# Patient Record
Sex: Male | Born: 1965
Health system: Southern US, Community
[De-identification: ages and names within clinical notes are randomized; demographics above are authoritative.]

## PROBLEM LIST (undated history)

## (undated) DIAGNOSIS — F419 Anxiety disorder, unspecified: Secondary | ICD-10-CM

## (undated) DIAGNOSIS — E785 Hyperlipidemia, unspecified: Secondary | ICD-10-CM

## (undated) DIAGNOSIS — T7840XA Allergy, unspecified, initial encounter: Secondary | ICD-10-CM

## (undated) DIAGNOSIS — F329 Major depressive disorder, single episode, unspecified: Secondary | ICD-10-CM

## (undated) DIAGNOSIS — F32A Depression, unspecified: Secondary | ICD-10-CM

## (undated) HISTORY — DX: Allergy, unspecified, initial encounter: T78.40XA

## (undated) HISTORY — DX: Anxiety disorder, unspecified: F41.9

## (undated) HISTORY — DX: Hyperlipidemia, unspecified: E78.5

## (undated) HISTORY — DX: Depression, unspecified: F32.A

---
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
## 2008-12-17 LAB — HM COLONOSCOPY

## 2009-08-24 ENCOUNTER — Ambulatory Visit: Payer: Self-pay | Admitting: Family Medicine

## 2009-10-25 ENCOUNTER — Ambulatory Visit: Payer: Self-pay | Admitting: Family Medicine

## 2009-11-17 LAB — HM COLONOSCOPY: HM Colonoscopy: NEGATIVE

## 2010-01-05 ENCOUNTER — Ambulatory Visit: Payer: Self-pay | Admitting: Family Medicine

## 2010-01-05 ENCOUNTER — Encounter: Admission: RE | Admit: 2010-01-05 | Discharge: 2010-01-05 | Payer: Self-pay | Admitting: Family Medicine

## 2010-01-06 ENCOUNTER — Ambulatory Visit: Payer: Self-pay | Admitting: Family Medicine

## 2010-04-05 ENCOUNTER — Ambulatory Visit: Payer: Self-pay | Admitting: Family Medicine

## 2010-12-17 HISTORY — PX: COLONOSCOPY: SHX174

## 2011-03-20 ENCOUNTER — Ambulatory Visit (INDEPENDENT_AMBULATORY_CARE_PROVIDER_SITE_OTHER): Payer: PRIVATE HEALTH INSURANCE | Admitting: Family Medicine

## 2011-03-20 DIAGNOSIS — E785 Hyperlipidemia, unspecified: Secondary | ICD-10-CM

## 2011-03-20 DIAGNOSIS — Z7189 Other specified counseling: Secondary | ICD-10-CM

## 2011-05-19 ENCOUNTER — Encounter: Payer: Self-pay | Admitting: Family Medicine

## 2011-05-22 ENCOUNTER — Ambulatory Visit (INDEPENDENT_AMBULATORY_CARE_PROVIDER_SITE_OTHER): Payer: PRIVATE HEALTH INSURANCE | Admitting: Family Medicine

## 2011-05-22 ENCOUNTER — Encounter: Payer: Self-pay | Admitting: Family Medicine

## 2011-05-22 VITALS — BP 130/84 | HR 72 | Wt 234.0 lb

## 2011-05-22 DIAGNOSIS — M722 Plantar fascial fibromatosis: Secondary | ICD-10-CM

## 2011-05-22 DIAGNOSIS — E669 Obesity, unspecified: Secondary | ICD-10-CM | POA: Insufficient documentation

## 2011-05-22 DIAGNOSIS — Z79899 Other long term (current) drug therapy: Secondary | ICD-10-CM

## 2011-05-22 DIAGNOSIS — E785 Hyperlipidemia, unspecified: Secondary | ICD-10-CM

## 2011-05-22 LAB — LIPID PANEL
Total CHOL/HDL Ratio: 4.7 Ratio
Triglycerides: 359 mg/dL — ABNORMAL HIGH (ref <150)
VLDL: 72 mg/dL — ABNORMAL HIGH (ref 0–40)

## 2011-05-22 LAB — HEPATIC FUNCTION PANEL
AST: 25 U/L (ref 0–37)
Alkaline Phosphatase: 70 U/L (ref 39–117)
Total Bilirubin: 0.5 mg/dL (ref 0.3–1.2)

## 2011-05-22 NOTE — Progress Notes (Signed)
  Subjective:    Patient ID: Garrett Holmes, male    DOB: Aug 01, 1966, 45 y.o.   MRN: [id number]  HPI He is here for a recheck. Psychologically he is doing better. Probably some of the things are causing trouble have been resolved. He says that he felt better after our last encounter just getting some levels negative thoughts out of his system. He also is on Lipitor and is having no difficulty with this. He quit smoking in January. He does continue to have foot pain. He has had surgery on his left foot which is not causing much trouble. The right foot is causing trouble.   Review of Systems     Objective:   Physical Exam alert and in no distress otherwise not examined        Assessment & Plan:  Situational stress. Dyslipidemia. Plantar fascitis I will check liver and lipid panel on him today I also discussed diet and exercise with him. Shunt encouraged him to make changes especially regard to carbohydrates. Also recommend using arch supports as much is possible to help the plantar fasciitis

## 2011-05-22 NOTE — Patient Instructions (Signed)
Continue on your Lipitor. Make sure you wear arch supports to help with your feet. The chart carbohydrate intake especially in regard to weight foods.

## 2011-05-23 ENCOUNTER — Telehealth: Payer: Self-pay

## 2011-05-23 NOTE — Telephone Encounter (Signed)
Pt informed labs look good

## 2011-11-05 ENCOUNTER — Encounter: Payer: Self-pay | Admitting: Family Medicine

## 2011-11-05 ENCOUNTER — Ambulatory Visit (INDEPENDENT_AMBULATORY_CARE_PROVIDER_SITE_OTHER): Payer: PRIVATE HEALTH INSURANCE | Admitting: Family Medicine

## 2011-11-05 VITALS — BP 122/80 | HR 62 | Wt 234.0 lb

## 2011-11-05 DIAGNOSIS — H9209 Otalgia, unspecified ear: Secondary | ICD-10-CM

## 2011-11-05 DIAGNOSIS — H9201 Otalgia, right ear: Secondary | ICD-10-CM

## 2011-11-05 NOTE — Patient Instructions (Signed)
Use a decongestant to help relieve your her pressure symptoms

## 2011-11-05 NOTE — Progress Notes (Signed)
  Subjective:    Patient ID: Garrett Holmes, male    DOB: 1966/04/19, 45 y.o.   MRN: [id number]  HPI He complains of a one-week history of right ear pressure as well as sinus pressure but no fever chills sore throat or earache. He does not smoke   Review of Systems     Objective:   Physical Exam alert and in no distress. Tympanic membranes and canals are normal. Throat is clear. Tonsils are normal. Neck is supple without adenopathy or thyromegaly. Cardiac exam shows a regular sinus rhythm without murmurs or gallops. Lungs are clear to auscultation.        Assessment & Plan:  Otalgia Patient to use a decongestant to help with symptoms.

## 2012-01-31 ENCOUNTER — Encounter: Payer: Self-pay | Admitting: Family Medicine

## 2012-01-31 ENCOUNTER — Ambulatory Visit (INDEPENDENT_AMBULATORY_CARE_PROVIDER_SITE_OTHER): Payer: PRIVATE HEALTH INSURANCE | Admitting: Family Medicine

## 2012-01-31 VITALS — BP 120/78 | HR 63 | Ht 68.0 in | Wt 219.0 lb

## 2012-01-31 DIAGNOSIS — E669 Obesity, unspecified: Secondary | ICD-10-CM

## 2012-01-31 DIAGNOSIS — J3489 Other specified disorders of nose and nasal sinuses: Secondary | ICD-10-CM

## 2012-01-31 DIAGNOSIS — G2571 Drug induced akathisia: Secondary | ICD-10-CM

## 2012-01-31 DIAGNOSIS — B356 Tinea cruris: Secondary | ICD-10-CM

## 2012-01-31 DIAGNOSIS — R259 Unspecified abnormal involuntary movements: Secondary | ICD-10-CM

## 2012-01-31 DIAGNOSIS — Z0000 Encounter for general adult medical examination without abnormal findings: Secondary | ICD-10-CM

## 2012-01-31 DIAGNOSIS — E785 Hyperlipidemia, unspecified: Secondary | ICD-10-CM

## 2012-01-31 MED ORDER — METOPROLOL SUCCINATE ER 25 MG PO TB24: 25.0000 mg | ORAL_TABLET | Freq: Every day | ORAL | Status: DC

## 2012-01-31 MED ORDER — FLUCONAZOLE 100 MG PO TABS: 100.0000 mg | ORAL_TABLET | Freq: Every day | ORAL | Status: AC

## 2012-01-31 NOTE — Progress Notes (Signed)
  Subjective:    Patient ID: Garrett Holmes, male    DOB: 1966-09-27, 46 y.o.   MRN: [id number]  HPI    Review of Systems     Objective:   Physical Exam BP 120/78  Pulse 63  Ht 5\' 8"  (1.727 m)  Wt 219 lb (99.338 kg)  BMI 33.30 kg/m2  General Appearance:    Alert, cooperative, no distress, appears stated age  Head:    Normocephalic, without obvious abnormality, atraumatic  Eyes:    PERRL, conjunctiva/corneas clear, EOM's intact, fundi    benign  Ears:    Normal TM's and external ear canals  Nose:   Nares normal, mucosa normal, no drainage or sinus   tenderness  Throat:   Lips, mucosa, and tongue normal; teeth and gums normal  Neck:   Supple, no lymphadenopathy;  thyroid:  no   enlargement/tenderness/nodules; no carotid   bruit or JVD  Back:    Spine nontender, no curvature, ROM normal, no CVA     tenderness  Lungs:     Clear to auscultation bilaterally without wheezes, rales or     ronchi; respirations unlabored  Chest Wall:    No tenderness or deformity   Heart:    Regular rate and rhythm, S1 and S2 normal, no murmur, rub   or gallop  Breast Exam:    No chest wall tenderness, masses or gynecomastia  Abdomen:     Soft, non-tender, nondistended, normoactive bowel sounds,    no masses, no hepatosplenomegaly  Genitalia:    Normal male external genitalia without lesions.  Testicles without masses.  No inguinal hernias.  Rectal:    Normal sphincter tone, no masses or tenderness; guaiac negative stool.  Prostate smooth, no nodules, not enlarged.  Extremities:   No clubbing, cyanosis or edema  Pulses:   2+ and symmetric all extremities  Skin:   Skin color, texture, turgor normal, erythematous and slightly raised and scaly lesions noted on the inner aspect of both thighs. Scrotum is clear  Exam of his nose does show a 0.5 cm pigmented lesion present regimen nose on the left   Lymph nodes:   Cervical, supraclavicular, and axillary nodes normal  Neurologic:   CNII-XII intact, normal  strength, sensation and gait; reflexes 2+ and symmetric throughout          Psych:   Normal mood, affect, hygiene and grooming.           Assessment & Plan:  BP 120/78  Pulse 63  Ht 5\' 8"  (1.727 m)  Wt 219 lb (99.338 kg)  BMI 33.30 kg/m2 1. Restless movement disorder  metoprolol succinate (TOPROL-XL) 25 MG 24 hr tablet  2. Nasal lesion  Ambulatory referral to Dermatology  3. Tinea cruris    4. Routine general medical examination at a health care facility    5. Hyperlipidemia  Lipid panel  6. Dyslipidemia    7. Obesity (BMI 30-39.9)     recommended for the tinea cruris Diflucan and also to use Lamisil. He will use the Lamisil regularly and spread it as much as he can to keep this under control. Also discussed dietary modification with him. He is to let me know how the Toprol is working.

## 2012-01-31 NOTE — Patient Instructions (Addendum)
Use Lamisil topical. Spread it as much as you can to keep it under control. I will give you Toprol and let renal pelvis works for your restlessness. It should not take long for this to work so call next week

## 2012-02-01 ENCOUNTER — Encounter: Payer: Self-pay | Admitting: Internal Medicine

## 2012-02-01 LAB — LIPID PANEL
Cholesterol: 156 mg/dL (ref 0–200)
HDL: 37 mg/dL — ABNORMAL LOW (ref >39)
LDL Cholesterol: 99 mg/dL (ref 0–99)
Triglycerides: 99 mg/dL (ref <150)
VLDL: 20 mg/dL (ref 0–40)

## 2012-02-01 MED ORDER — ATORVASTATIN CALCIUM 40 MG PO TABS: 40.0000 mg | ORAL_TABLET | Freq: Every day | ORAL | Status: DC

## 2012-02-01 NOTE — Progress Notes (Signed)
Addended by: Ronnald Nian on: 02/01/2012 08:41 AM   Modules accepted: Orders

## 2012-02-19 ENCOUNTER — Telehealth: Payer: Self-pay | Admitting: Family Medicine

## 2012-02-19 NOTE — Telephone Encounter (Signed)
Pt called and stated that Toprol is not working and he never got a call about referral to dermatology. Pt states he will call dermatologist himself and set up appointment but wanted you to know med is not working. Pt uses [facility] [address]

## 2012-02-19 NOTE — Telephone Encounter (Signed)
Apparently the history part of his exam is missing for unknown reasons. Make sure he gets the dermatology appointment set up. Have him followup with me concerning the Toprol.

## 2012-02-20 NOTE — Telephone Encounter (Signed)
Pt is scheduled to follow-up with you tomorrow about med not working and he has already scheduled himself to dermatology

## 2012-02-21 ENCOUNTER — Encounter: Payer: Self-pay | Admitting: Family Medicine

## 2012-02-21 ENCOUNTER — Ambulatory Visit (INDEPENDENT_AMBULATORY_CARE_PROVIDER_SITE_OTHER): Payer: PRIVATE HEALTH INSURANCE | Admitting: Family Medicine

## 2012-02-21 VITALS — BP 114/70 | HR 55 | Wt 227.0 lb

## 2012-02-21 DIAGNOSIS — G2581 Restless legs syndrome: Secondary | ICD-10-CM

## 2012-02-21 MED ORDER — PRAMIPEXOLE DIHYDROCHLORIDE 0.125 MG PO TABS: 0.1250 mg | ORAL_TABLET | Freq: Three times a day (TID) | ORAL | Status: DC

## 2012-02-21 NOTE — Patient Instructions (Addendum)
Take one pill near bedtime for several days and if no improvement then increase that to 2 pills and then to 3 and then even to 4 if we need to. Let me know what strength works or if you have side effects

## 2012-02-21 NOTE — Progress Notes (Signed)
  Subjective:    Patient ID: Garrett Holmes, male    DOB: 11/19/1966, 46 y.o.   MRN: [id number]  HPI He is here for a recheck. His dictation on the last visit is incomplete. He has a long history of difficulty with restless legs and in the past had tried both Requip and clonazepam with inadequate results. The restless legs bothers him mainly at night but does start prior to him going to bed.  Review of Systems     Objective:   Physical Exam Alert and in no distress otherwise not examined       Assessment & Plan:   1. RLS (restless legs syndrome)    I will switch him to Mirapex. Discussed slowly increasing this based on its response. If he does not respond to this, referral to neurology will probably be made.

## 2012-08-20 ENCOUNTER — Other Ambulatory Visit: Payer: Self-pay | Admitting: Family Medicine

## 2012-08-26 ENCOUNTER — Telehealth: Payer: Self-pay | Admitting: Family Medicine

## 2012-08-26 NOTE — Telephone Encounter (Signed)
LM

## 2013-02-02 ENCOUNTER — Ambulatory Visit (INDEPENDENT_AMBULATORY_CARE_PROVIDER_SITE_OTHER): Payer: BC Managed Care – PPO | Admitting: Family Medicine

## 2013-02-02 ENCOUNTER — Encounter: Payer: Self-pay | Admitting: Family Medicine

## 2013-02-02 VITALS — BP 130/88 | HR 68 | Ht 68.5 in | Wt 233.0 lb

## 2013-02-02 DIAGNOSIS — F40240 Claustrophobia: Secondary | ICD-10-CM

## 2013-02-02 DIAGNOSIS — M7710 Lateral epicondylitis, unspecified elbow: Secondary | ICD-10-CM

## 2013-02-02 DIAGNOSIS — Z0000 Encounter for general adult medical examination without abnormal findings: Secondary | ICD-10-CM

## 2013-02-02 DIAGNOSIS — M7711 Lateral epicondylitis, right elbow: Secondary | ICD-10-CM

## 2013-02-02 DIAGNOSIS — B356 Tinea cruris: Secondary | ICD-10-CM

## 2013-02-02 DIAGNOSIS — E669 Obesity, unspecified: Secondary | ICD-10-CM

## 2013-02-02 DIAGNOSIS — F40298 Other specified phobia: Secondary | ICD-10-CM

## 2013-02-02 DIAGNOSIS — M722 Plantar fascial fibromatosis: Secondary | ICD-10-CM

## 2013-02-02 DIAGNOSIS — E785 Hyperlipidemia, unspecified: Secondary | ICD-10-CM

## 2013-02-02 LAB — HEMOCCULT GUIAC POC 1CARD (OFFICE)

## 2013-02-02 LAB — CBC WITH DIFFERENTIAL/PLATELET
Basophils Absolute: 0.1 10*3/uL (ref 0.0–0.1)
Basophils Relative: 0 % (ref 0–1)
HCT: 43.4 % (ref 39.0–52.0)
Lymphocytes Relative: 23 % (ref 12–46)
MCH: 30 pg (ref 26.0–34.0)
Neutrophils Relative %: 66 % (ref 43–77)
Platelets: 255 10*3/uL (ref 150–400)
RDW: 13.9 % (ref 11.5–15.5)
WBC: 11.5 10*3/uL — ABNORMAL HIGH (ref 4.0–10.5)

## 2013-02-02 LAB — POCT URINALYSIS DIPSTICK
Nitrite, UA: NEGATIVE
Protein, UA: NEGATIVE

## 2013-02-02 MED ORDER — ATORVASTATIN CALCIUM 40 MG PO TABS: ORAL_TABLET | ORAL | Status: DC

## 2013-02-02 MED ORDER — FLUCONAZOLE 100 MG PO TABS: 100.0000 mg | ORAL_TABLET | Freq: Every day | ORAL | Status: DC

## 2013-02-02 NOTE — Patient Instructions (Addendum)
If you note aches and pains in her muscles while on the Diflucan let me know Do as many things as you can palms up and open Do the stretching which her feet use the rolling pin; use arch supports versus heel cups

## 2013-02-02 NOTE — Progress Notes (Signed)
Subjective:    Patient ID: Garrett Holmes, male    DOB: Jun 06, 1966, 47 y.o.   MRN: [id number]  HPI He is here for complete examination. He has had difficulty with tinea cruris and in the past had used Diflucan 100 mg for 10 days followed by Lamisil topical but unfortunately he would get a recurrence of the condition.he also has a very long history of difficulty with restless legs and in the past had tried Mirapex, Requip and Klonopin all of which have not worked well. He also complains of symptoms consistent with claustrophobia. He cannot write a car for long distance before he gets quite antsy. He cannot going to very close spaces again due to becoming quite anxious. He also complains of bilateral plantar fasciitis that he has had treated in the past. He is starting to treat him again with conservative measures. He apparently did have ultrasound treatment of this. He also has bilateral lateral elbow pain especially with work related activities. He continues on Lipitor.he is up-to-date on his immunizations, has had a colonoscopy.his work and home life are going well.   Review of Systems  Constitutional: Negative.   HENT: Negative.   Eyes: Negative.   Cardiovascular: Negative.   Gastrointestinal: Negative.   Endocrine: Negative.   Genitourinary: Negative.   Musculoskeletal: Negative.   Allergic/Immunologic: Negative.   Neurological: Negative.   Hematological: Negative.        Objective:   Physical Exam BP 130/88  Pulse 68  Ht 5' 8.5" (1.74 m)  Wt 233 lb (105.688 kg)  BMI 34.91 kg/m2  SpO2 98%  General Appearance:    Alert, cooperative, no distress, appears stated age  Head:    Normocephalic, without obvious abnormality, atraumatic  Eyes:    PERRL, conjunctiva/corneas clear, EOM's intact, fundi    benign  Ears:    Normal TM's and external ear canals  Nose:   Nares normal, mucosa normal, no drainage or sinus   tenderness  Throat:   Lips, mucosa, and tongue normal; teeth and gums  normal  Neck:   Supple, no lymphadenopathy;  thyroid:  no   enlargement/tenderness/nodules; no carotid   bruit or JVD  Back:    Spine nontender, no curvature, ROM normal, no CVA     tenderness  Lungs:     Clear to auscultation bilaterally without wheezes, rales or     ronchi; respirations unlabored  Chest Wall:    No tenderness or deformity   Heart:    Regular rate and rhythm, S1 and S2 normal, no murmur, rub   or gallop  Breast Exam:    No chest wall tenderness, masses or gynecomastia  Abdomen:     Soft, non-tender, nondistended, normoactive bowel sounds,    no masses, no hepatosplenomegaly  Genitalia:    Normal male external genitalia without lesions.  Testicles without masses.  No inguinal hernias.  Rectal:    Normal sphincter tone, no masses or tenderness; guaiac negative stool.  Prostate smooth, no nodules, not enlarged.  Extremities:   No clubbing, cyanosis or edema.tender to palpation over both lateral epicondyles with good motion of the elbows. He is also tender to palpation over the bilateral calcaneal spurs with good motion of the ankles.  Pulses:   2+ and symmetric all extremities  Skin:   Skin color, texture, turgor normal, no rashes or lesions  Lymph nodes:   Cervical, supraclavicular, and axillary nodes normal  Neurologic:   CNII-XII intact, normal strength, sensation and gait; reflexes 2+ and  symmetric throughout          Psych:   Normal mood, affect, hygiene and grooming.           Assessment & Plan:  Routine general medical examination at a health care facility - Plan: POCT Urinalysis Dipstick, Lipid panel, CBC with Differential, Comprehensive metabolic panel, Hemoccult - 1 Card (office)  Tinea cruris - Plan: fluconazole (DIFLUCAN) 100 MG tablet  Claustrophobia  Obesity (BMI 30-39.9)  Plantar fasciitis, bilateral  Hyperlipidemia - Plan: Lipid panel, atorvastatin (LIPITOR) 40 MG tablet  Lateral epicondylitis of both elbows he is to use the Diflucan for one month  and also use a topical. We will try to get to the point where he can use topical to keep the tinea under control. Discussed to claustrophobia with him. At this point no therapy is necessary. For the plantar fasciitis he is to continue with conservative care. Also discussed using nighttime splints. For the lateral epicondylitis, recommend doing as many things as he can palms up and open. We discussed weight loss with him especially in regard to cutting back on carbohydrates.

## 2013-02-03 ENCOUNTER — Other Ambulatory Visit: Payer: Self-pay

## 2013-02-03 DIAGNOSIS — E782 Mixed hyperlipidemia: Secondary | ICD-10-CM

## 2013-02-03 LAB — COMPREHENSIVE METABOLIC PANEL
Albumin: 4.7 g/dL (ref 3.5–5.2)
Alkaline Phosphatase: 73 U/L (ref 39–117)
CO2: 27 mEq/L (ref 19–32)
Calcium: 9.9 mg/dL (ref 8.4–10.5)
Chloride: 103 mEq/L (ref 96–112)
Potassium: 4.9 mEq/L (ref 3.5–5.3)
Total Bilirubin: 0.6 mg/dL (ref 0.3–1.2)
Total Protein: 7.1 g/dL (ref 6.0–8.3)

## 2013-02-03 LAB — LIPID PANEL
Cholesterol: 206 mg/dL — ABNORMAL HIGH (ref 0–200)
HDL: 47 mg/dL (ref >39)
Triglycerides: 422 mg/dL — ABNORMAL HIGH (ref <150)

## 2013-02-03 NOTE — Progress Notes (Signed)
Quick Note:  Let him know that his triglycerides are quite elevated. Send her dietary information and have him recheck that in 2 months. ______

## 2013-02-03 NOTE — Progress Notes (Signed)
Quick Note:  Mailed pt letter of results and diet along with apt. To come in for lab ______

## 2013-04-01 ENCOUNTER — Telehealth: Payer: Self-pay | Admitting: Family Medicine

## 2013-04-01 NOTE — Telephone Encounter (Signed)
I have form and have faxed to [facility] [phone]

## 2013-04-01 NOTE — Telephone Encounter (Signed)
Pt called and wanted status on the [employer] form that he sent to you, some type of waiver.  Please advise

## 2013-04-02 ENCOUNTER — Other Ambulatory Visit: Payer: Self-pay

## 2013-04-03 ENCOUNTER — Other Ambulatory Visit: Payer: BC Managed Care – PPO

## 2013-04-06 ENCOUNTER — Encounter: Payer: Self-pay | Admitting: Family Medicine

## 2013-04-27 ENCOUNTER — Other Ambulatory Visit: Payer: Self-pay | Admitting: Family Medicine

## 2013-05-12 ENCOUNTER — Telehealth: Payer: Self-pay | Admitting: Internal Medicine

## 2013-05-12 NOTE — Telephone Encounter (Signed)
Pt needs a refill on diflucan 100mg  to [address]

## 2013-05-12 NOTE — Telephone Encounter (Signed)
Have him set up an appointment for followup.

## 2013-05-12 NOTE — Telephone Encounter (Signed)
Pt needs a refill on diflucan 100mg to [facility] [address] 

## 2013-05-13 ENCOUNTER — Encounter: Payer: Self-pay | Admitting: Family Medicine

## 2013-05-13 ENCOUNTER — Ambulatory Visit (INDEPENDENT_AMBULATORY_CARE_PROVIDER_SITE_OTHER): Payer: BC Managed Care – PPO | Admitting: Family Medicine

## 2013-05-13 VITALS — BP 120/80 | HR 63 | Wt 233.0 lb

## 2013-05-13 DIAGNOSIS — L909 Atrophic disorder of skin, unspecified: Secondary | ICD-10-CM

## 2013-05-13 DIAGNOSIS — B356 Tinea cruris: Secondary | ICD-10-CM

## 2013-05-13 DIAGNOSIS — L918 Other hypertrophic disorders of the skin: Secondary | ICD-10-CM

## 2013-05-13 MED ORDER — TERBINAFINE HCL 250 MG PO TABS: 250.0000 mg | ORAL_TABLET | Freq: Every day | ORAL | Status: DC

## 2013-05-13 NOTE — Progress Notes (Signed)
  Subjective:    Patient ID: Garrett Holmes, male    DOB: [birth year], 47 y.o.   MRN: [id number]  HPI He is here for recheck. He states that the Diflucan did help control his rash but did not make it go way and that started to reappear recently. He also has a skin tag present in the right inner thigh area it is giving him trouble mechanically.   Review of Systems     Objective:   Physical Exam 0.5 cm skin tag noted in the right upper inguinal area. He also has a dry pigmented oval-appearing rash with raised edges. A KOH was positive.       Assessment & Plan:  Skin tag  Tinea cruris - Plan: terbinafine (LAMISIL) 250 MG tablet the skin tag was removed and hyfrecated with silver nitrate without difficulty. I will have him use the Lamisil and topical antifungal regularly for the next month and then have him call me.

## 2013-05-13 NOTE — Patient Instructions (Signed)
Use both topical and oral medication for the next month and then call me to let me know how you're doing. If it's normal then go ahead ands use a topical daily and then every other day and every third day whatever it takes to keep it under control

## 2013-07-28 ENCOUNTER — Ambulatory Visit (INDEPENDENT_AMBULATORY_CARE_PROVIDER_SITE_OTHER): Payer: BC Managed Care – PPO | Admitting: Family Medicine

## 2013-07-28 ENCOUNTER — Encounter: Payer: Self-pay | Admitting: Family Medicine

## 2013-07-28 VITALS — BP 112/76 | HR 60 | Wt 228.0 lb

## 2013-07-28 DIAGNOSIS — B356 Tinea cruris: Secondary | ICD-10-CM

## 2013-07-28 MED ORDER — FLUCONAZOLE 100 MG PO TABS: 100.0000 mg | ORAL_TABLET | Freq: Every day | ORAL | Status: DC

## 2013-07-28 NOTE — Progress Notes (Signed)
  Subjective:    Patient ID: Garrett Holmes, male    DOB: [birth year], 47 y.o.   MRN: [id number]  HPI He is here for recheck. He states that the Lamisil has not had any effect at all. Further discussion with him indicates that Diflucan did indeed get him back to normal skin. Review of record indicates he stated better rather than totally back to normal.   Review of Systems     Objective:   Physical Exam Exam of the inguinal area does show slight dryness, hyperpigmentation especially on the inner thighs with well-demarcated borders.       Assessment & Plan:  Tinea cruris - Plan: fluconazole (DIFLUCAN) 100 MG tablet Take the Diflucan for 2 weeks and see what that'll do to your skin and if you have any aches and pains I need to know. Once the skin is under control back off and use the Diflucan once a week and see if he keeps it under control. If it does then back off to twice per month I explained to him that we had a communication issue of me thinking he was just better but not back to normal. He understands this. We have come up with a game plan to try and keep this under good control. I discussed possible side effects of Diflucan and Lipitor with him.

## 2013-07-28 NOTE — Patient Instructions (Signed)
Take the Diflucan for 2 weeks and see what that'll do to your skin and if you have any aches and pains I need to know. Once the skin is under control back off and use the Diflucan once a week and see if he keeps it under control. If it does then back off to twice per month

## 2013-12-15 ENCOUNTER — Encounter: Payer: Self-pay | Admitting: Family Medicine

## 2013-12-15 ENCOUNTER — Ambulatory Visit (INDEPENDENT_AMBULATORY_CARE_PROVIDER_SITE_OTHER): Payer: BC Managed Care – PPO | Admitting: Family Medicine

## 2013-12-15 VITALS — BP 114/76 | HR 60

## 2013-12-15 DIAGNOSIS — B356 Tinea cruris: Secondary | ICD-10-CM

## 2013-12-15 DIAGNOSIS — F411 Generalized anxiety disorder: Secondary | ICD-10-CM

## 2013-12-15 DIAGNOSIS — F419 Anxiety disorder, unspecified: Secondary | ICD-10-CM

## 2013-12-15 MED ORDER — ALPRAZOLAM 0.25 MG PO TABS: 0.2500 mg | ORAL_TABLET | Freq: Two times a day (BID) | ORAL | Status: DC | PRN

## 2013-12-15 MED ORDER — FLUCONAZOLE 100 MG PO TABS: 100.0000 mg | ORAL_TABLET | Freq: Every day | ORAL | Status: DC

## 2013-12-15 NOTE — Progress Notes (Signed)
   Subjective:    Patient ID: Garrett Holmes, male    DOB: 1966/12/08, 47 y.o.   MRN: [id number]  HPI He is continued had difficulty with tinea cruris. Apparently Diflucan does work and he has been trying to spread out the dosing of this unfortunately the infection has reoccurred. He would like renewal of the medication. At the end of the encounter he then mentioned difficulty dealing with it anxiety, anger, mood swings. He states that anything can get him angry. His wife and children have noted this. During the encounter he did become quite tearful. He relates that in the past he was given Lexapro several years ago but never got involved in counseling. He cannot relate his mood swings and anger to any particular issue.   Review of Systems     Objective:   Physical Exam Alert and in no distress but tearful when discussing his anxiety and anger. Exam of the inguinal area does show slight drying and pigmentation to the inner aspect of both thighs.      Assessment & Plan:  Tinea cruris - Plan: fluconazole (DIFLUCAN) 100 MG tablet  Anxiety - Plan: ALPRAZolam (XANAX) 0.25 MG tablet  discussed the use of Diflucan regularly until he has it under control and then spreading it out as far as he can but not allowing the lesions to recur. Explained that this is a control issue not sure. We then discussed anxiety and anger. I recommended that he set up an appointment with Darryl Hyers. Explained that I will give Xanax but not without counseling. He did seem to understand this and accepted. Followup on this in several weeks.

## 2013-12-15 NOTE — Patient Instructions (Signed)
Garrett Holmes [phone] 

## 2014-03-25 ENCOUNTER — Telehealth: Payer: Self-pay | Admitting: Internal Medicine

## 2014-03-25 NOTE — Telephone Encounter (Signed)
Faxed over medical records to [facility] on march 17th

## 2014-06-30 ENCOUNTER — Other Ambulatory Visit: Payer: Self-pay | Admitting: Family Medicine

## 2014-10-16 ENCOUNTER — Telehealth: Payer: Self-pay | Admitting: Internal Medicine

## 2014-10-16 NOTE — Telephone Encounter (Signed)
Faxed over medical records to [facility] on 10/07/14 @ [phone]

## 2015-02-15 ENCOUNTER — Other Ambulatory Visit: Payer: Self-pay | Admitting: Family Medicine

## 2015-02-15 ENCOUNTER — Ambulatory Visit
Admission: RE | Admit: 2015-02-15 | Discharge: 2015-02-15 | Disposition: A | Discharge status: Home / Self Care | Payer: BLUE CROSS/BLUE SHIELD | Source: Ambulatory Visit | Attending: Family Medicine | Admitting: Family Medicine

## 2015-02-15 DIAGNOSIS — K625 Hemorrhage of anus and rectum: Secondary | ICD-10-CM

## 2015-02-15 DIAGNOSIS — Z789 Other specified health status: Secondary | ICD-10-CM

## 2015-06-02 ENCOUNTER — Ambulatory Visit (INDEPENDENT_AMBULATORY_CARE_PROVIDER_SITE_OTHER): Payer: BLUE CROSS/BLUE SHIELD | Admitting: Family Medicine

## 2015-06-02 ENCOUNTER — Encounter: Payer: Self-pay | Admitting: Family Medicine

## 2015-06-02 VITALS — BP 126/82 | HR 60 | Wt 218.0 lb

## 2015-06-02 DIAGNOSIS — B356 Tinea cruris: Secondary | ICD-10-CM | POA: Diagnosis not present

## 2015-06-02 MED ORDER — FLUCONAZOLE 100 MG PO TABS: 100.0000 mg | ORAL_TABLET | Freq: Every day | ORAL | Status: DC

## 2015-06-02 NOTE — Progress Notes (Signed)
   Subjective:    Patient ID: Garrett Holmes, male    DOB: [birth year], 49 y.o.   MRN: [id number]  HPI He is here for evaluation of continued difficulty with any a. He has a long history of difficulty with this. In the past we have had to use oral Diflucan to get this under control. He otherwise has trouble in the summer months. He started having difficulty one month ago and did start using topical he also had some Diflucan left over and did take this sparingly.   Review of Systems     Objective:   Physical Exam Exam of the inguinal area does show erythema with some central clearing.       Assessment & Plan:  Tinea cruris - Plan: fluconazole (DIFLUCAN) 100 MG tablet I will treat him for a full month with Diflucan. He is also to use Lamisil topically. Hopefully this will get this under control. Recommended he try and control this topically with Lamisil long-term

## 2015-06-02 NOTE — Patient Instructions (Signed)
Use the Fluconazolefor  a solid month and also use Lamisil.Once it's under control then you can use the topical but try and spread it out every other day and then every third day and maybe even further to keep it under control

## 2015-09-13 ENCOUNTER — Encounter: Payer: Self-pay | Admitting: Family Medicine

## 2015-09-13 ENCOUNTER — Ambulatory Visit (INDEPENDENT_AMBULATORY_CARE_PROVIDER_SITE_OTHER): Payer: BLUE CROSS/BLUE SHIELD | Admitting: Family Medicine

## 2015-09-13 VITALS — BP 126/80 | HR 70 | Wt 223.0 lb

## 2015-09-13 DIAGNOSIS — F32A Depression, unspecified: Secondary | ICD-10-CM

## 2015-09-13 DIAGNOSIS — F329 Major depressive disorder, single episode, unspecified: Secondary | ICD-10-CM | POA: Diagnosis not present

## 2015-09-13 MED ORDER — ESCITALOPRAM OXALATE 10 MG PO TABS: 10.0000 mg | ORAL_TABLET | Freq: Every day | ORAL | Status: DC

## 2015-09-13 NOTE — Patient Instructions (Signed)
Garrett Holmes [phone] 

## 2015-09-13 NOTE — Progress Notes (Signed)
   Subjective:    Patient ID: Garrett Holmes, male    DOB: 09-Jan-1966, 49 y.o.   MRN: [id number]  HPI He is here for consultation concerning a one-year history of difficulty psychologically. He admits to having episodes of being quite angry, difficulty with sleep, becoming quite tearful, anhedonia. He has no suicidal ideation. He does have a previous history of difficulty with depression and was placed on Lexapro several years ago. He has been doing well until the last year. He cites no stressors in his life causing this. He did not get counseling with his first bout of depression.   Review of Systems     Objective:   Physical Exam Alert and in no distress but slightly tearful. Dressed appropriately with appropriate affect. PH Q9 of 12       Assessment & Plan:  Depression  I will start him on Lexapro 10 mg. Encouraged him to get involved in counseling with Darryl Hyers. Return here in one month.

## 2015-10-11 ENCOUNTER — Ambulatory Visit (INDEPENDENT_AMBULATORY_CARE_PROVIDER_SITE_OTHER): Payer: BLUE CROSS/BLUE SHIELD | Admitting: Family Medicine

## 2015-10-11 ENCOUNTER — Encounter: Payer: Self-pay | Admitting: Family Medicine

## 2015-10-11 VITALS — BP 130/80 | HR 67 | Ht 69.0 in | Wt 225.0 lb

## 2015-10-11 DIAGNOSIS — F329 Major depressive disorder, single episode, unspecified: Secondary | ICD-10-CM

## 2015-10-11 DIAGNOSIS — F32A Depression, unspecified: Secondary | ICD-10-CM

## 2015-10-11 MED ORDER — ESCITALOPRAM OXALATE 20 MG PO TABS: 20.0000 mg | ORAL_TABLET | Freq: Every day | ORAL | Status: DC

## 2015-10-11 NOTE — Progress Notes (Signed)
   Subjective:    Patient ID: Garrett Holmes, male    DOB: 01-23-1966, 48 y.o.   MRN: [id number]  HPI He is here for recheck. He states he started feeling much better approximately one week and to taking the medication. He thinks that he is back to normal but not quite sure. He is very happy with the progress that he has made. He has noted some slight decrease in libido as well as ejaculatory function.   Review of Systems     Objective:   Physical Exam Urgent and in no distress with appropriate affect.       Assessment & Plan:  Depression - Plan: escitalopram (LEXAPRO) 20 MG tablet  I will increase him to 20 mg. He will call in one month and let me know whether he seen any improvement in his symptoms or an increase in his side effects.

## 2015-10-11 NOTE — Patient Instructions (Signed)
Call me in a month and let me know how you are doing 

## 2015-10-28 IMAGING — CR DG CHEST 2V
2 series · 2 of 2 positions shown · non-contrast
Comparison: 01/05/2010.

CLINICAL DATA: Smoker.

EXAM:
CHEST  2 VIEW

[view not recorded (1 of 2)]
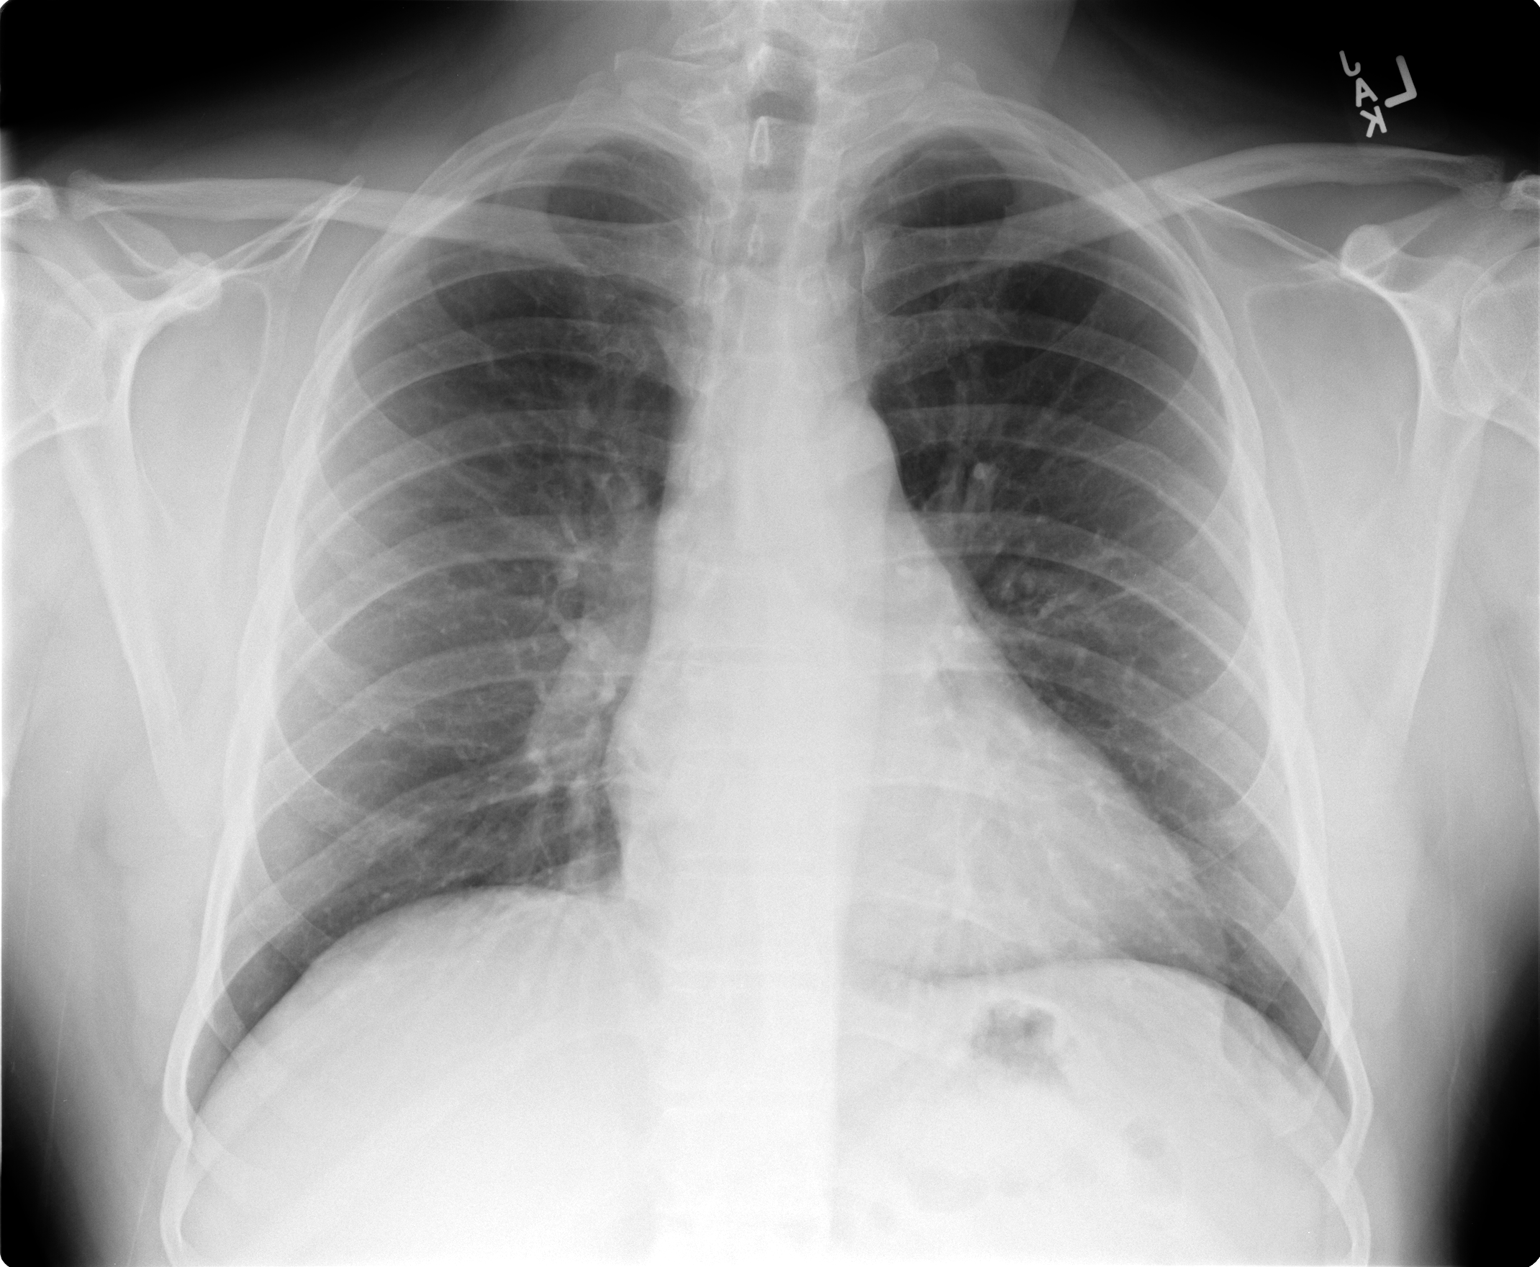

[view not recorded (2 of 2)]
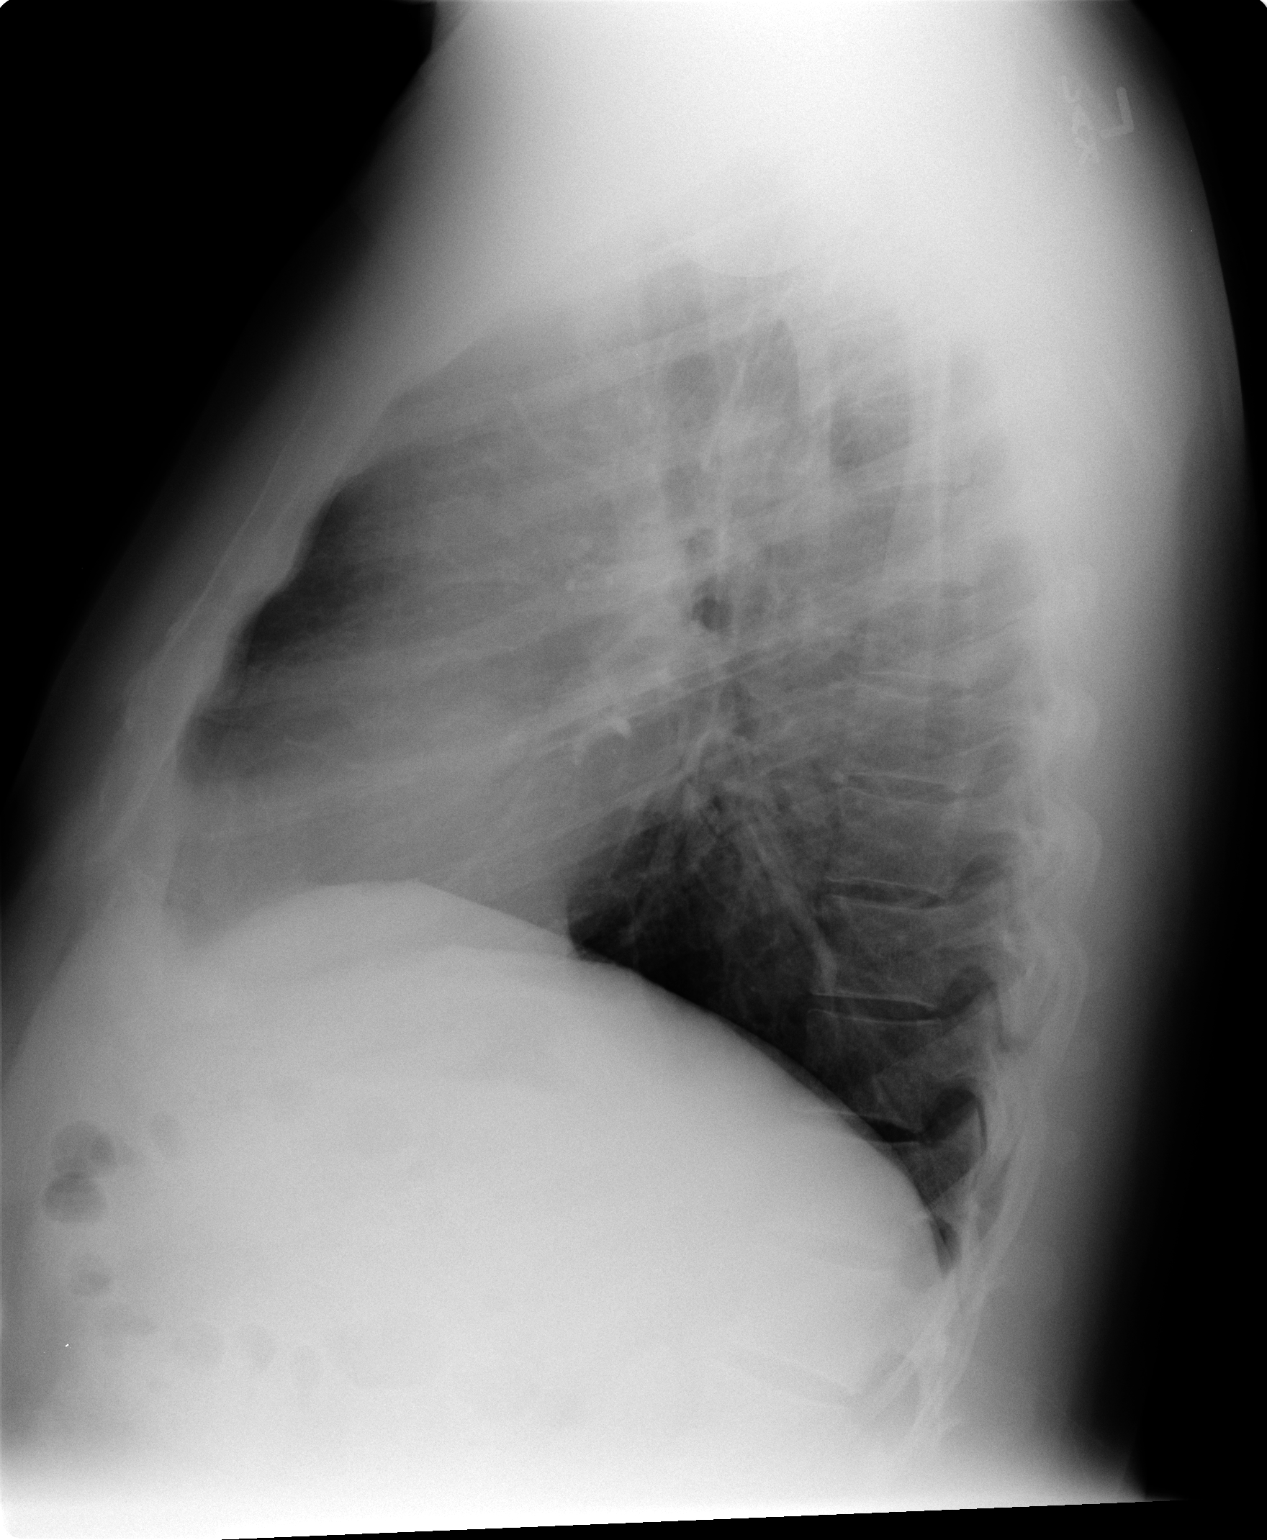

[2 of 2 positions shown; findings below may reference images not displayed]

FINDINGS: Mediastinum hilar structures normal. Lungs are clear. Heart size
stable. No pleural effusion or pneumothorax. No acute osseous
abnormality.
IMPRESSION: No acute cardiopulmonary disease.  Stable chest from 01/05/2010.

## 2015-12-05 ENCOUNTER — Other Ambulatory Visit: Payer: Self-pay | Admitting: Family Medicine

## 2016-02-05 ENCOUNTER — Other Ambulatory Visit: Payer: Self-pay | Admitting: Family Medicine

## 2016-02-13 ENCOUNTER — Other Ambulatory Visit: Payer: Self-pay | Admitting: Family Medicine

## 2016-03-13 ENCOUNTER — Ambulatory Visit (INDEPENDENT_AMBULATORY_CARE_PROVIDER_SITE_OTHER): Payer: BLUE CROSS/BLUE SHIELD | Admitting: Family Medicine

## 2016-03-13 ENCOUNTER — Encounter: Payer: Self-pay | Admitting: Family Medicine

## 2016-03-13 VITALS — BP 130/88 | HR 76 | Resp 16 | Ht 69.0 in | Wt 235.0 lb

## 2016-03-13 DIAGNOSIS — E669 Obesity, unspecified: Secondary | ICD-10-CM | POA: Diagnosis not present

## 2016-03-13 DIAGNOSIS — K5900 Constipation, unspecified: Secondary | ICD-10-CM | POA: Insufficient documentation

## 2016-03-13 DIAGNOSIS — E785 Hyperlipidemia, unspecified: Secondary | ICD-10-CM | POA: Diagnosis not present

## 2016-03-13 DIAGNOSIS — Z87891 Personal history of nicotine dependence: Secondary | ICD-10-CM

## 2016-03-13 DIAGNOSIS — J3089 Other allergic rhinitis: Secondary | ICD-10-CM

## 2016-03-13 DIAGNOSIS — Z0000 Encounter for general adult medical examination without abnormal findings: Secondary | ICD-10-CM | POA: Diagnosis not present

## 2016-03-13 DIAGNOSIS — L570 Actinic keratosis: Secondary | ICD-10-CM | POA: Diagnosis not present

## 2016-03-13 LAB — CBC WITH DIFFERENTIAL/PLATELET
BASOS PCT: 0 % (ref 0–1)
Basophils Absolute: 0 10*3/uL (ref 0.0–0.1)
EOS ABS: 0.3 10*3/uL (ref 0.0–0.7)
EOS PCT: 4 % (ref 0–5)
HEMATOCRIT: 41.4 % (ref 39.0–52.0)
Hemoglobin: 14 g/dL (ref 13.0–17.0)
Lymphocytes Relative: 26 % (ref 12–46)
Lymphs Abs: 1.7 10*3/uL (ref 0.7–4.0)
MCH: 29.7 pg (ref 26.0–34.0)
MCHC: 33.8 g/dL (ref 30.0–36.0)
MCV: 87.9 fL (ref 78.0–100.0)
MONO ABS: 1.3 10*3/uL — AB (ref 0.1–1.0)
MPV: 11 fL (ref 8.6–12.4)
Monocytes Relative: 20 % — ABNORMAL HIGH (ref 3–12)
Neutro Abs: 3.4 10*3/uL (ref 1.7–7.7)
Neutrophils Relative %: 50 % (ref 43–77)
Platelets: 197 10*3/uL (ref 150–400)
RBC: 4.71 MIL/uL (ref 4.22–5.81)
RDW: 13.8 % (ref 11.5–15.5)
WBC: 6.7 10*3/uL (ref 4.0–10.5)

## 2016-03-13 LAB — POCT URINALYSIS DIP (MANUAL ENTRY)
Bilirubin, UA: NEGATIVE
Glucose, UA: NEGATIVE
Ketones, POC UA: NEGATIVE
Leukocytes, UA: NEGATIVE
NITRITE UA: NEGATIVE
PH UA: 6
PROTEIN UA: NEGATIVE
RBC UA: NEGATIVE
SPEC GRAV UA: 1.025
UROBILINOGEN UA: 0.2

## 2016-03-13 MED ORDER — LIPITOR 40 MG PO TABS: 40.0000 mg | ORAL_TABLET | Freq: Every day | ORAL | Status: DC

## 2016-03-13 NOTE — Patient Instructions (Signed)
20 minutes a day of something physical. Use your breaks and walk Cut back on carbohydrates and easiest way to remember that is white food

## 2016-03-13 NOTE — Progress Notes (Signed)
Subjective:    Patient ID: Garrett Holmes, male    DOB: 22-Jul-1966, 50 y.o.   MRN: [id number]  HPI He is here for a complete examination. He has been having difficulty with shortness of breath with exertion over the last year but worse in the last several months. No PND, orthopnea, chest pain, shortness of breath. During the same time frame he has actually gained 17 pounds. He is now taking Lexapro and getting great results from this. He has been on this for several months. He does have underlying allergies as treating them on an as-needed basis. He has had difficulty in the past with constipation and has seen Dr. Benson Norway for this. Presently he is on Linzess and getting good results. He continues to do well on his Lipitor. Does have a lesion present on his right cheek venous pain cause some slight irritation.His exercise is quite minimal. He is a former smoker. Family and social history as well as health maintenance and immunizations were reviewed.   Review of Systems  All other systems reviewed and are negative.      Objective:   Physical Exam BP 130/88 mmHg  Pulse 76  Resp 16  Ht 5\' 9"  (1.753 m)  Wt 235 lb (106.595 kg)  BMI 34.69 kg/m2  General Appearance:    Alert, cooperative, no distress, appears stated age  Head:    Normocephalic, without obvious abnormality, atraumatic  Eyes:    PERRL, conjunctiva/corneas clear, EOM's intact, fundi    benign  Ears:    Normal TM's and external ear canals  Nose:   Nares normal, mucosa normal, no drainage or sinus   tenderness  Throat:   Lips, mucosa, and tongue normal; teeth and gums normal  Neck:   Supple, no lymphadenopathy;  thyroid:  no   enlargement/tenderness/nodules; no carotid   bruit or JVD  Back:    Spine nontender, no curvature, ROM normal, no CVA     tenderness  Lungs:     Clear to auscultation bilaterally without wheezes, rales or     ronchi; respirations unlabored  Chest Wall:    No tenderness or deformity   Heart:    Regular rate  and rhythm, S1 and S2 normal, no murmur, rub   or gallop     Abdomen:     Soft, non-tender, nondistended, normoactive bowel sounds,    no masses, no hepatosplenomegaly        Extremities:   No clubbing, cyanosis or edema  Pulses:   2+ and symmetric all extremities  Skin:   Skin color, texture, turgor normal, round slightly scaly approximately one and half centimeter lesion noted on the right cheek.  Lymph nodes:   Cervical, supraclavicular, and axillary nodes normal  Neurologic:   CNII-XII intact, normal strength, sensation and gait; reflexes 2+ and symmetric throughout          Psych:   Normal mood, affect, hygiene and grooming.          Assessment & Plan:  Physical exam - Plan: POCT urinalysis dipstick, CBC with Differential/Platelet, Comprehensive metabolic panel, Lipid panel  Actinic keratosis - Plan: Ambulatory referral to Dermatology  Obesity (BMI 30-39.9) - Plan: CBC with Differential/Platelet, Comprehensive metabolic panel, Lipid panel  Hyperlipidemia - Plan: Lipid panel, LIPITOR 40 MG tablet  Other allergic rhinitis  Constipation, unspecified constipation type  Former smoker discussed diet and exercise with him.encouraged 20 minutes of something physical daily as well as cutting back on carbohydrates. Discussed the possibility of  this Lexapro causing this and we will readdress this at a later date. He will continue on his constipation medication. He will see dermatology for further evaluation of the skin lesion.

## 2016-03-14 LAB — COMPREHENSIVE METABOLIC PANEL
ALK PHOS: 57 U/L (ref 40–115)
ALT: 41 U/L (ref 9–46)
AST: 31 U/L (ref 10–40)
Albumin: 4.1 g/dL (ref 3.6–5.1)
BILIRUBIN TOTAL: 0.5 mg/dL (ref 0.2–1.2)
BUN: 12 mg/dL (ref 7–25)
CALCIUM: 8.8 mg/dL (ref 8.6–10.3)
CO2: 23 mmol/L (ref 20–31)
CREATININE: 1.04 mg/dL (ref 0.60–1.35)
Chloride: 104 mmol/L (ref 98–110)
GLUCOSE: 88 mg/dL (ref 65–99)
Potassium: 4.3 mmol/L (ref 3.5–5.3)
SODIUM: 137 mmol/L (ref 135–146)
Total Protein: 6.8 g/dL (ref 6.1–8.1)

## 2016-03-14 LAB — LIPID PANEL
Cholesterol: 234 mg/dL — ABNORMAL HIGH (ref 125–200)
HDL: 42 mg/dL (ref >=40)
LDL CALC: 125 mg/dL (ref <130)
Total CHOL/HDL Ratio: 5.6 Ratio — ABNORMAL HIGH (ref <=5.0)
Triglycerides: 333 mg/dL — ABNORMAL HIGH (ref <150)
VLDL: 67 mg/dL — ABNORMAL HIGH (ref <30)

## 2016-11-01 ENCOUNTER — Other Ambulatory Visit: Payer: Self-pay | Admitting: Family Medicine

## 2016-12-12 ENCOUNTER — Other Ambulatory Visit: Payer: Self-pay | Admitting: Family Medicine

## 2016-12-12 ENCOUNTER — Encounter: Payer: Self-pay | Admitting: Medical

## 2016-12-12 ENCOUNTER — Telehealth: Payer: Self-pay | Admitting: Medical

## 2016-12-12 ENCOUNTER — Ambulatory Visit (INDEPENDENT_AMBULATORY_CARE_PROVIDER_SITE_OTHER): Payer: BLUE CROSS/BLUE SHIELD | Admitting: Medical

## 2016-12-12 VITALS — BP 138/88 | HR 78 | Wt 249.4 lb

## 2016-12-12 DIAGNOSIS — H6503 Acute serous otitis media, bilateral: Secondary | ICD-10-CM

## 2016-12-12 NOTE — Progress Notes (Signed)
Subjective Chief Complaint  Patient presents with  . lt ear    left ear feel like it fluid in it    He notes feeling of fluid in left ear x few weeks.  Has had some sneezing.  No cough, no sore throat, no fever.  Has felt some head congestion.   Nonsmoker.  No sick contacts.  Tried some allergy medication but didn't help much.  On the phone a lot, so he feels pressure in the left ear.  He denies hx/o having wax removed.   No other aggravating or relieving factors. No other complaint.   Past Medical History:  Diagnosis Date  . Allergy   . Dyslipidemia   . Hyperlipidemia    Current Outpatient Prescriptions on File Prior to Visit  Medication Sig Dispense Refill  . ALPRAZolam (XANAX) 0.25 MG tablet Take 1 tablet (0.25 mg total) by mouth 2 (two) times daily as needed for anxiety. 20 tablet 0  . escitalopram (LEXAPRO) 20 MG tablet TAKE 1 TABLET (20 MG TOTAL) BY MOUTH DAILY. 90 tablet 0  . fluconazole (DIFLUCAN) 100 MG tablet TAKE 1 TABLET BY MOUTH DAILY. 30 tablet 3  . Linaclotide (LINZESS) 145 MCG CAPS capsule Take 145 mcg by mouth daily.    Marland Kitchen LIPITOR 40 MG tablet Take 1 tablet (40 mg total) by mouth at bedtime. 90 tablet 3  . [DISCONTINUED] metoprolol succinate (TOPROL-XL) 25 MG 24 hr tablet Take 1 tablet (25 mg total) by mouth daily. 30 tablet 11   No current facility-administered medications on file prior to visit.    ROS as in subjective   Objective: BP 138/88   Pulse 78   Wt 249 lb 6.4 oz (113.1 kg)   SpO2 97%   BMI 36.83 kg/m   BP Readings from Last 3 Encounters:  12/12/16 138/88  03/13/16 130/88  10/11/15 130/80   General appearance: alert, no distress, WD/WN HEENT: normocephalic, sclerae anicteric,serous effusions bilat TMs without erythema, nares patent, no discharge or erythema, pharynx normal Oral cavity: MMM, no lesions Neck: supple, no lymphadenopathy, no thyromegaly, no masses Lungs: CTA bilaterally, no wheezes, rhonchi, or rales    Assessment: Encounter  Diagnosis  Name Primary?  . Bilateral acute serous otitis media, recurrence not specified Yes     Plan Serous otitis media  Recommendations  Increase water intake hourly so you are swallowing which can release the ear pressure  Consider taking Mucinex Sinus Max that contains phenylephrine  Consider Nasacort or Flonase nasal OTC  Consider nasal saline flush  If not resolved within a week then call back.  Yolanda was seen today for lt ear.  Diagnoses and all orders for this visit:  Bilateral acute serous otitis media, recurrence not specified

## 2016-12-12 NOTE — Telephone Encounter (Signed)
Called and l/m for patient , for him to call us back

## 2016-12-12 NOTE — Telephone Encounter (Signed)
Is this okay to refill? 

## 2016-12-12 NOTE — Patient Instructions (Signed)
Serous otitis media  Recommendations  Increase water intake hourly so you are swallowing which can release the ear pressure  Consider taking Mucinex Sinus Max that contains phenylephrine  Consider Nasacort or Flonase nasal OTC  Consider nasal saline flush    Using Saline Nose Drops with Bulb Syringe A bulb syringe is used to clear your nose. You may use it when you have a stuffy nose, nasal congestion, sinus pressure, or sneezing.   SALINE SOLUTION You can buy nose drops at your local drug store. You can also make nose drops yourself. Mix 1 cup of water with  teaspoon of salt. Stir. Store this mixture at room temperature. Make a new batch daily.  USE THE BULB IN COMBINATION WITH SALINE NOSE DROPS  Squeeze the air out of the bulb before suctioning the saline mixture.  While still squeezing the bulb flat, place the tip of the bulb into the saline mixture.  Let air come back into the bulb.  This will suction up the saline mixture.  Gently flush one nostril at a time.  Salt water nose drops will then moisten your  congested nose and loosen secretions before suctioning.  Use the bulb syringe as directed below to suction.  USING THE BULB SYRINGE TO SUCTION  While still squeezing the bulb flat, place the tip of the bulb into a nostril. Let air come back into the bulb. The suction will pull snot out of the nose and into the bulb.  Repeat on the other nostril.  Squeeze syringe several times into a tissue.  CLEANING THE BULB SYRINGE  Clean the bulb syringe every day with hot soapy water.  Clean the inside of the bulb by squeezing the bulb while the tip is in soapy water.  Rinse by squeezing the bulb while the tip is in clean hot water.  Store the bulb with the tip side down on paper towel.  HOME CARE INSTRUCTIONS   Use saline nose drops often to keep the nose open and not stuffy.  Throw away used salt water. Make a new solution every time.  Do not use the same solution  and dropper for another person  If you do not prefer to use nasal saline flush, other options include nasal saline spray or the [employer], both of which are available over the counter at your pharmacy.

## 2016-12-12 NOTE — Telephone Encounter (Signed)
Please call him back. I didn't get a chance to hear his complaint about his ankle.  I apologize.  (he was here for ears, but mentioned the ankle).  Find out what his issue was, how long has it been going on, any particular injury, and what has he done to help it?

## 2016-12-13 NOTE — Telephone Encounter (Signed)
Spoke pt he said that he will call back to make another appt to come in to talk to you about it. He was at work when I called .

## 2016-12-26 ENCOUNTER — Ambulatory Visit (INDEPENDENT_AMBULATORY_CARE_PROVIDER_SITE_OTHER): Payer: BLUE CROSS/BLUE SHIELD | Admitting: Family Medicine

## 2016-12-26 ENCOUNTER — Encounter: Payer: Self-pay | Admitting: Family Medicine

## 2016-12-26 VITALS — BP 130/90 | HR 76 | Ht 69.0 in | Wt 246.0 lb

## 2016-12-26 DIAGNOSIS — F32A Depression, unspecified: Secondary | ICD-10-CM

## 2016-12-26 DIAGNOSIS — B356 Tinea cruris: Secondary | ICD-10-CM | POA: Diagnosis not present

## 2016-12-26 DIAGNOSIS — F329 Major depressive disorder, single episode, unspecified: Secondary | ICD-10-CM

## 2016-12-26 DIAGNOSIS — R030 Elevated blood-pressure reading, without diagnosis of hypertension: Secondary | ICD-10-CM

## 2016-12-26 MED ORDER — FLUCONAZOLE 100 MG PO TABS: 100.0000 mg | ORAL_TABLET | Freq: Every day | ORAL | 3 refills | Status: DC

## 2016-12-26 MED ORDER — ESCITALOPRAM OXALATE 20 MG PO TABS: ORAL_TABLET | ORAL | 3 refills | Status: DC

## 2016-12-26 NOTE — Progress Notes (Signed)
   Subjective:    Patient ID: Garrett Holmes, male    DOB: November 09, 1966, 51 y.o.   MRN: [id number]  HPI He is here for consult concerning his blood pressure. He has noted several readings recently that were not under good control he is here for follow-up on that. He also would like a refill on his Lexapro. He has been very stable on that and wants to continue. He also has a history of tinea cruris that has been recalcitrant to treatment and would like to continue to use the Diflucan which apparently has been working fairly well. He is also noted increasing shortness of breath with physical activity. He probably does have a previous history of allergies and questionable history of asthma. States that he sometimes wheezes.   Review of Systems     Objective:   Physical Exam Alert and in no distress blood pressure is recorded.       Assessment & Plan:  Elevated blood pressure reading  Depression, unspecified depression type - Plan: escitalopram (LEXAPRO) 20 MG tablet  Tinea cruris - Plan: fluconazole (DIFLUCAN) 100 MG tablet Medications were renewed. Discussed treatment of his blood pressure but also recommend diet and exercise changes. He is to return here in the next month or 2 for complete examination to discuss all the other questions he has.

## 2017-03-18 ENCOUNTER — Ambulatory Visit (INDEPENDENT_AMBULATORY_CARE_PROVIDER_SITE_OTHER): Payer: BLUE CROSS/BLUE SHIELD | Admitting: Family Medicine

## 2017-03-18 ENCOUNTER — Encounter: Payer: Self-pay | Admitting: Family Medicine

## 2017-03-18 VITALS — BP 120/74 | HR 65 | Ht 69.0 in | Wt 245.0 lb

## 2017-03-18 DIAGNOSIS — Z0000 Encounter for general adult medical examination without abnormal findings: Secondary | ICD-10-CM

## 2017-03-18 DIAGNOSIS — B356 Tinea cruris: Secondary | ICD-10-CM | POA: Diagnosis not present

## 2017-03-18 DIAGNOSIS — J3089 Other allergic rhinitis: Secondary | ICD-10-CM | POA: Diagnosis not present

## 2017-03-18 DIAGNOSIS — K5900 Constipation, unspecified: Secondary | ICD-10-CM | POA: Diagnosis not present

## 2017-03-18 DIAGNOSIS — E785 Hyperlipidemia, unspecified: Secondary | ICD-10-CM

## 2017-03-18 DIAGNOSIS — F341 Dysthymic disorder: Secondary | ICD-10-CM | POA: Diagnosis not present

## 2017-03-18 DIAGNOSIS — E669 Obesity, unspecified: Secondary | ICD-10-CM | POA: Diagnosis not present

## 2017-03-18 LAB — CBC WITH DIFFERENTIAL/PLATELET
BASOS ABS: 0 {cells}/uL (ref 0–200)
Basophils Relative: 0 %
EOS PCT: 3 %
Eosinophils Absolute: 327 cells/uL (ref 15–500)
HCT: 43.8 % (ref 38.5–50.0)
HEMOGLOBIN: 14.9 g/dL (ref 13.2–17.1)
LYMPHS ABS: 2289 {cells}/uL (ref 850–3900)
Lymphocytes Relative: 21 %
MCH: 30.8 pg (ref 27.0–33.0)
MCHC: 34 g/dL (ref 32.0–36.0)
MCV: 90.5 fL (ref 80.0–100.0)
MONOS PCT: 9 %
MPV: 11.6 fL (ref 7.5–12.5)
Monocytes Absolute: 981 cells/uL — ABNORMAL HIGH (ref 200–950)
NEUTROS ABS: 7303 {cells}/uL (ref 1500–7800)
Neutrophils Relative %: 67 %
Platelets: 229 10*3/uL (ref 140–400)
RBC: 4.84 MIL/uL (ref 4.20–5.80)
RDW: 13.8 % (ref 11.0–15.0)
WBC: 10.9 10*3/uL — ABNORMAL HIGH (ref 4.0–10.5)

## 2017-03-18 LAB — POCT URINALYSIS DIPSTICK
Bilirubin, UA: NEGATIVE
Blood, UA: NEGATIVE
Glucose, UA: NEGATIVE
KETONES UA: NEGATIVE
LEUKOCYTES UA: NEGATIVE
NITRITE UA: NEGATIVE
PH UA: 6 (ref 5.0–8.0)
PROTEIN UA: NEGATIVE
Spec Grav, UA: 1.03 (ref 1.030–1.035)
Urobilinogen, UA: NEGATIVE (ref ?–2.0)

## 2017-03-18 LAB — COMPREHENSIVE METABOLIC PANEL
ALT: 40 U/L (ref 9–46)
AST: 23 U/L (ref 10–40)
Albumin: 4.3 g/dL (ref 3.6–5.1)
Alkaline Phosphatase: 88 U/L (ref 40–115)
BILIRUBIN TOTAL: 0.5 mg/dL (ref 0.2–1.2)
BUN: 17 mg/dL (ref 7–25)
CHLORIDE: 103 mmol/L (ref 98–110)
CO2: 21 mmol/L (ref 20–31)
Calcium: 9.3 mg/dL (ref 8.6–10.3)
Creat: 0.92 mg/dL (ref 0.60–1.35)
GLUCOSE: 95 mg/dL (ref 65–99)
Potassium: 4.6 mmol/L (ref 3.5–5.3)
Sodium: 136 mmol/L (ref 135–146)
Total Protein: 7.2 g/dL (ref 6.1–8.1)

## 2017-03-18 LAB — LIPID PANEL
CHOL/HDL RATIO: 8.2 ratio — AB (ref <5.0)
Cholesterol: 313 mg/dL — ABNORMAL HIGH (ref <200)
HDL: 38 mg/dL — AB (ref >40)
Triglycerides: 1281 mg/dL — ABNORMAL HIGH (ref <150)

## 2017-03-18 MED ORDER — FLUCONAZOLE 100 MG PO TABS: 100.0000 mg | ORAL_TABLET | Freq: Every day | ORAL | 3 refills | Status: DC

## 2017-03-18 MED ORDER — ESCITALOPRAM OXALATE 10 MG PO TABS: 10.0000 mg | ORAL_TABLET | Freq: Every day | ORAL | 0 refills | Status: DC

## 2017-03-18 MED ORDER — LIPITOR 40 MG PO TABS: 40.0000 mg | ORAL_TABLET | Freq: Every day | ORAL | 3 refills | Status: DC

## 2017-03-18 NOTE — Progress Notes (Signed)
Subjective:    Patient ID: Garrett Holmes, male    DOB: [birth year], 51 y.o.   MRN: [id number]  HPI He is here for complete examination. He has had some difficulty with intermittent left lateral foot discomfort but cannot relate this to any physical activity, overuse, new shoes. He has been on Lexapro and states that it has brought him back to his normal state. He has not gotten involved in counseling. He rarely uses Xanax. Continues on Lipitor for treatment of his lipids. His exercise and eating habits are really unchanged over the last year. He has had difficulty with constipation and presently is being seen by Dr. Benson Norway. He is taking Linzess with good results. He has been also using Diflucan for treatment of tinea cruris. He gets under fairly good control and he does back off on using it and it reoccurs. He does have underlying allergies and they give him very little difficulty. These occur mainly in the fall. His work and home life are going well. Family and social history as well as health maintenance and immunizations was reviewed.   Review of Systems  All other systems reviewed and are negative.      Objective:   Physical Exam BP 120/74   Pulse 65   Ht 5\' 9"  (1.753 m)   Wt 245 lb (111.1 kg)   SpO2 96%   BMI 36.18 kg/m   General Appearance:    Alert, cooperative, no distress, appears stated age  Head:    Normocephalic, without obvious abnormality, atraumatic  Eyes:    PERRL, conjunctiva/corneas clear, EOM's intact, fundi    benign  Ears:    Normal TM's and external ear canals  Nose:   Nares normal, mucosa normal, no drainage or sinus   tenderness  Throat:   Lips, mucosa, and tongue normal; teeth and gums normal  Neck:   Supple, no lymphadenopathy;  thyroid:  no   enlargement/tenderness/nodules; no carotid   bruit or JVD     Lungs:     Clear to auscultation bilaterally without wheezes, rales or     ronchi; respirations unlabored      Heart:    Regular rate and rhythm, S1 and S2  normal, no murmur, rub   or gallop     Abdomen:     Soft, non-tender, nondistended, normoactive bowel sounds,    no masses, no hepatosplenomegaly  Genitalia:    Normal male external genitalia without lesions.  Testicles without masses.  No inguinal hernias.  Rectal:   Deferred   Extremities:   No clubbing, cyanosis or edema  Pulses:   2+ and symmetric all extremities  Skin:   Skin color, texture, turgor normal, Hyperpigmentation as well as some central clearing is noted in the crural area.   Lymph nodes:   Cervical, supraclavicular, and axillary nodes normal  Neurologic:   CNII-XII intact, normal strength, sensation and gait; reflexes 2+ and symmetric throughout          Psych:   Normal mood, affect, hygiene and grooming.         Assessment & Plan:  Routine general medical examination at a health care facility - Plan: POCT Urinalysis Dipstick, CBC with Differential/Platelet, Comprehensive metabolic panel, Lipid panel  Tinea cruris - Plan: fluconazole (DIFLUCAN) 100 MG tablet  Obesity (BMI 30-39.9)  Hyperlipidemia, unspecified hyperlipidemia type - Plan: LIPITOR 40 MG tablet, Lipid panel  Constipation, unspecified constipation type  Other allergic rhinitis  Dysthymia - Plan: escitalopram (LEXAPRO) 10 MG tablet  I again discussed diet and exercise with him. Discussed cutting back on carbohydrates as well as increasing his physical activity. His Lipitor will be renewed. He will continue on his Linzess. Discussed allergy treatment and he will continue with TC medications. We also discussed stopping the Lexapro. I will cutting down the 10 mg for one month and then stop. He will keep me informed as to how he is doing and if he starts to slip, we might start this again. I also discussed the possibility of him getting involved in counseling to help determine exactly where the problem originated. We then went over treatment of his tinea. He is to take Diflucan as well as using topical Lamisil  regularly for the next month to get this under control then back off on the Diflucan only using the topical. He will then back off on this and ordered keep this under control. Explained that this is a control not cure issue.

## 2017-03-18 NOTE — Patient Instructions (Signed)
Use Flonase regularly and if you, need to then use Claritin or Allegr for the sneezing itchy watery eyes and runny nose,

## 2017-03-22 ENCOUNTER — Ambulatory Visit (INDEPENDENT_AMBULATORY_CARE_PROVIDER_SITE_OTHER): Payer: BLUE CROSS/BLUE SHIELD | Admitting: Family Medicine

## 2017-03-22 DIAGNOSIS — E785 Hyperlipidemia, unspecified: Secondary | ICD-10-CM

## 2017-03-22 DIAGNOSIS — Z91199 Patient's noncompliance with other medical treatment and regimen due to unspecified reason: Secondary | ICD-10-CM

## 2017-03-22 DIAGNOSIS — E669 Obesity, unspecified: Secondary | ICD-10-CM | POA: Diagnosis not present

## 2017-03-22 DIAGNOSIS — Z9119 Patient's noncompliance with other medical treatment and regimen: Secondary | ICD-10-CM | POA: Diagnosis not present

## 2017-03-22 NOTE — Progress Notes (Signed)
   Subjective:    Patient ID: Garrett Holmes, male    DOB: July 20, 1966, 51 y.o.   MRN: [id number]  HPI He is here for recheck. Recent blood work did show elevated cholesterol as well as a very high elevated triglycerides. He now admits that he has not been taking his statin on a regular basis and over the weekend did imbibe heavily of alcohol.   Review of Systems     Objective:   Physical Exam Alert and in no distress otherwise not examined       Assessment & Plan:  Obesity (BMI 30-39.9)  Hyperlipidemia, unspecified hyperlipidemia type  Personal history of noncompliance with medical treatment, presenting hazards to health I discussed the lack of taking the statin regularly as well as his alcohol consumption and its effect on his triglycerides. Encouraged him to always be honest with me concerning taking his medications. He is to start back on a statin drug. Also encouraged him to abstain from alcohol and make dietary changes especially in regard to carbohydrates. Recheck here in 2 months.

## 2017-04-11 ENCOUNTER — Telehealth: Payer: Self-pay | Admitting: Family Medicine

## 2017-04-11 NOTE — Telephone Encounter (Signed)
Rcvd refill request for Escitalopram 10 mg #90

## 2017-04-12 NOTE — Telephone Encounter (Signed)
Faxed request back to pharmacy with Dr Lanice Shirts request

## 2017-04-12 NOTE — Telephone Encounter (Signed)
If they fax this tell them to send through [place]

## 2017-04-19 ENCOUNTER — Telehealth: Payer: Self-pay

## 2017-04-19 DIAGNOSIS — F341 Dysthymic disorder: Secondary | ICD-10-CM

## 2017-04-19 MED ORDER — ESCITALOPRAM OXALATE 10 MG PO TABS: 10.0000 mg | ORAL_TABLET | Freq: Every day | ORAL | 5 refills | Status: DC

## 2017-04-19 NOTE — Telephone Encounter (Signed)
Fax request from [id number] - [place], [place] for refill of escitalopram. Garrett Holmes December

## 2017-05-24 ENCOUNTER — Other Ambulatory Visit: Payer: Commercial Managed Care - PPO

## 2017-05-24 DIAGNOSIS — E7889 Other lipoprotein metabolism disorders: Secondary | ICD-10-CM

## 2017-05-25 LAB — LIPID PANEL
Cholesterol: 166 mg/dL (ref <200)
HDL: 38 mg/dL — ABNORMAL LOW (ref >40)
LDL CALC: 74 mg/dL (ref <100)
Total CHOL/HDL Ratio: 4.4 Ratio (ref <5.0)
Triglycerides: 272 mg/dL — ABNORMAL HIGH (ref <150)
VLDL: 54 mg/dL — AB (ref <30)

## 2017-07-11 ENCOUNTER — Telehealth: Payer: Self-pay | Admitting: Family Medicine

## 2017-07-11 NOTE — Telephone Encounter (Signed)
Pt called & states his new ins will no longer cover Lipitor, he has been on brand in past & with discount card was only $4 and now $344.  Called pharmacy & ins will not cover brand name so no help with discount card.  Ran as generic $10 for 30 days, ins will not pay for 90 days at a time.  Pt is ok with switching to generic due to cost.  Left message for pt.

## 2017-10-16 ENCOUNTER — Other Ambulatory Visit: Payer: Self-pay | Admitting: Family Medicine

## 2017-10-16 DIAGNOSIS — E785 Hyperlipidemia, unspecified: Secondary | ICD-10-CM

## 2017-11-21 ENCOUNTER — Other Ambulatory Visit: Payer: Self-pay | Admitting: Family Medicine

## 2017-11-21 DIAGNOSIS — F341 Dysthymic disorder: Secondary | ICD-10-CM

## 2017-11-21 NOTE — Telephone Encounter (Signed)
Have him set up an appointment within the next month or 2

## 2017-11-21 NOTE — Telephone Encounter (Signed)
appt schedule 12/05/17 RLB

## 2017-12-05 ENCOUNTER — Encounter: Payer: Commercial Managed Care - PPO | Admitting: Family Medicine

## 2018-03-09 ENCOUNTER — Other Ambulatory Visit: Payer: Self-pay | Admitting: Family Medicine

## 2018-03-09 DIAGNOSIS — F341 Dysthymic disorder: Secondary | ICD-10-CM

## 2018-03-10 NOTE — Telephone Encounter (Signed)
[facility] is requesting  To refill pt lexapro. Please advise [facility]

## 2018-03-20 ENCOUNTER — Ambulatory Visit (INDEPENDENT_AMBULATORY_CARE_PROVIDER_SITE_OTHER): Payer: Commercial Managed Care - PPO | Admitting: Family Medicine

## 2018-03-20 ENCOUNTER — Encounter: Payer: Self-pay | Admitting: Family Medicine

## 2018-03-20 VITALS — BP 126/88 | HR 61 | Temp 97.6°F | Ht 68.0 in | Wt 256.4 lb

## 2018-03-20 DIAGNOSIS — R030 Elevated blood-pressure reading, without diagnosis of hypertension: Secondary | ICD-10-CM

## 2018-03-20 DIAGNOSIS — R0683 Snoring: Secondary | ICD-10-CM

## 2018-03-20 DIAGNOSIS — F341 Dysthymic disorder: Secondary | ICD-10-CM | POA: Diagnosis not present

## 2018-03-20 DIAGNOSIS — K5900 Constipation, unspecified: Secondary | ICD-10-CM | POA: Diagnosis not present

## 2018-03-20 DIAGNOSIS — Z0000 Encounter for general adult medical examination without abnormal findings: Secondary | ICD-10-CM | POA: Diagnosis not present

## 2018-03-20 DIAGNOSIS — E785 Hyperlipidemia, unspecified: Secondary | ICD-10-CM

## 2018-03-20 DIAGNOSIS — J3089 Other allergic rhinitis: Secondary | ICD-10-CM

## 2018-03-20 DIAGNOSIS — E669 Obesity, unspecified: Secondary | ICD-10-CM

## 2018-03-20 LAB — POCT URINALYSIS DIP (PROADVANTAGE DEVICE)
Bilirubin, UA: NEGATIVE
Glucose, UA: NEGATIVE mg/dL
Ketones, POC UA: NEGATIVE mg/dL
Leukocytes, UA: NEGATIVE
Nitrite, UA: NEGATIVE
PROTEIN UA: NEGATIVE mg/dL
RBC UA: NEGATIVE
SPECIFIC GRAVITY, URINE: 1.01
Urobilinogen, Ur: 3.5
pH, UA: 6 (ref 5.0–8.0)

## 2018-03-20 MED ORDER — ATORVASTATIN CALCIUM 40 MG PO TABS: 40.0000 mg | ORAL_TABLET | Freq: Every day | ORAL | 3 refills | Status: DC

## 2018-03-20 MED ORDER — ESCITALOPRAM OXALATE 10 MG PO TABS: 10.0000 mg | ORAL_TABLET | Freq: Every day | ORAL | 3 refills | Status: DC

## 2018-03-20 NOTE — Addendum Note (Signed)
Addended by: Elyse Jarvis on: 03/20/2018 10:44 AM   Modules accepted: Orders

## 2018-03-20 NOTE — Patient Instructions (Signed)
20 minutes of something physical every day or an equivalent of 10,000 steps

## 2018-03-20 NOTE — Progress Notes (Signed)
Subjective:    Patient ID: Garrett Holmes, male    DOB: Feb 23, 1966, 52 y.o.   MRN: [id number]  HPI He is here for a complete examination.  His wife wants him checked for sleep apnea.  Apparently she has noted that he stops breathing at night.  He does admit to snoring but states that he wakes up refreshed.  He does use an OTC sleep med dating he needs that to get a full night's rest.  He continues on Lexapro and states that he is very happy with the results of this medication.  He has not gotten involved in counseling.  He rarely uses his Xanax.  He does use Linzess intermittently for his constipation and has seen Dr. Almyra Free in the past.  He is also had a colonoscopy done.  Review of Systems  All other systems reviewed and are negative.      Objective:   Physical Exam BP 126/88 (BP Location: Left Arm, Patient Position: Sitting)   Pulse 61   Temp 97.6 F (36.4 C)   Ht 5\' 8"  (1.727 m)   Wt 256 lb 6.4 oz (116.3 kg)   SpO2 97%   BMI 38.99 kg/m   General Appearance:    Alert, cooperative, no distress, appears stated age  Head:    Normocephalic, without obvious abnormality, atraumatic  Eyes:    PERRL, conjunctiva/corneas clear, EOM's intact, fundi    benign  Ears:    Normal TM's and external ear canals  Nose:   Nares normal, mucosa normal, no drainage or sinus   tenderness  Throat:   Lips, mucosa, and tongue normal; teeth and gums normal  Neck:   Supple, no lymphadenopathy;  thyroid:  no   enlargement/tenderness/nodules; no carotid   bruit or JVD     Lungs:     Clear to auscultation bilaterally without wheezes, rales or     ronchi; respirations unlabored      Heart:    Regular rate and rhythm, S1 and S2 normal, no murmur, rub   or gallop     Abdomen:     Soft, non-tender, nondistended, normoactive bowel sounds,    no masses, no hepatosplenomegaly  Genitalia:    Normal male external genitalia without lesions.  Testicles without masses.  No inguinal hernias.  Rectal:  deferred    Extremities:   No clubbing, cyanosis or edema  Pulses:   2+ and symmetric all extremities  Skin:   Skin color, texture, turgor normal, no rashes or lesions  Lymph nodes:   Cervical, supraclavicular, and axillary nodes normal  Neurologic:   CNII-XII intact, normal strength, sensation and gait; reflexes 2+ and symmetric throughout          Psych:   Normal mood, affect, hygiene and grooming.    Epworth of 6      Assessment & Plan:  Routine general medical examination at a health care facility - Plan: CBC with Differential/Platelet, Comprehensive metabolic panel, Lipid panel  Snoring - Plan: Home sleep test  Hyperlipidemia, unspecified hyperlipidemia type - Plan: Lipid panel, atorvastatin (LIPITOR) 40 MG tablet  Dysthymia - Plan: escitalopram (LEXAPRO) 10 MG tablet  Obesity (BMI 30-39.9) - Plan: CBC with Differential/Platelet, Comprehensive metabolic panel, Lipid panel  Constipation, unspecified constipation type  Other allergic rhinitis  Elevated blood pressure reading - Plan: CBC with Differential/Platelet, Comprehensive metabolic panel I will order a sleep study on him.  Also discussed stopping the sleep med but will do with this at a later date.  Encouraged him to increase his physical activity as well as making further dietary changes.  Expressed the fact that with his weight and blood pressure all these things are interconnected as well as risk of diabetes etc.  He is to come back here in 1 month for blood pressure recheck.  Strongly encouraged him to increase his physical activity.

## 2018-03-21 LAB — COMPREHENSIVE METABOLIC PANEL
ALBUMIN: 4.4 g/dL (ref 3.5–5.5)
ALK PHOS: 87 IU/L (ref 39–117)
ALT: 35 IU/L (ref 0–44)
AST: 22 IU/L (ref 0–40)
Albumin/Globulin Ratio: 1.8 (ref 1.2–2.2)
BUN / CREAT RATIO: 12 (ref 9–20)
BUN: 12 mg/dL (ref 6–24)
Bilirubin Total: 0.4 mg/dL (ref 0.0–1.2)
CALCIUM: 9.4 mg/dL (ref 8.7–10.2)
CO2: 23 mmol/L (ref 20–29)
Chloride: 102 mmol/L (ref 96–106)
Creatinine, Ser: 0.97 mg/dL (ref 0.76–1.27)
GFR calc Af Amer: 106 mL/min/{1.73_m2} (ref >59)
GFR, EST NON AFRICAN AMERICAN: 91 mL/min/{1.73_m2} (ref >59)
GLOBULIN, TOTAL: 2.4 g/dL (ref 1.5–4.5)
GLUCOSE: 92 mg/dL (ref 65–99)
Potassium: 4.4 mmol/L (ref 3.5–5.2)
SODIUM: 139 mmol/L (ref 134–144)
Total Protein: 6.8 g/dL (ref 6.0–8.5)

## 2018-03-21 LAB — CBC WITH DIFFERENTIAL/PLATELET
BASOS ABS: 0 10*3/uL (ref 0.0–0.2)
Basos: 0 %
EOS (ABSOLUTE): 0.3 10*3/uL (ref 0.0–0.4)
Eos: 2 %
Hematocrit: 41.9 % (ref 37.5–51.0)
Hemoglobin: 13.9 g/dL (ref 13.0–17.7)
IMMATURE GRANS (ABS): 0.1 10*3/uL (ref 0.0–0.1)
IMMATURE GRANULOCYTES: 1 %
LYMPHS: 24 %
Lymphocytes Absolute: 2.7 10*3/uL (ref 0.7–3.1)
MCH: 29.6 pg (ref 26.6–33.0)
MCHC: 33.2 g/dL (ref 31.5–35.7)
MCV: 89 fL (ref 79–97)
MONOS ABS: 0.9 10*3/uL (ref 0.1–0.9)
Monocytes: 8 %
NEUTROS ABS: 7.1 10*3/uL — AB (ref 1.4–7.0)
NEUTROS PCT: 65 %
Platelets: 231 10*3/uL (ref 150–379)
RBC: 4.69 x10E6/uL (ref 4.14–5.80)
RDW: 13.9 % (ref 12.3–15.4)
WBC: 11.2 10*3/uL — ABNORMAL HIGH (ref 3.4–10.8)

## 2018-03-21 LAB — LIPID PANEL
CHOL/HDL RATIO: 4.7 ratio (ref 0.0–5.0)
Cholesterol, Total: 200 mg/dL — ABNORMAL HIGH (ref 100–199)
HDL: 43 mg/dL (ref >39)
LDL Calculated: 107 mg/dL — ABNORMAL HIGH (ref 0–99)
Triglycerides: 248 mg/dL — ABNORMAL HIGH (ref 0–149)
VLDL Cholesterol Cal: 50 mg/dL — ABNORMAL HIGH (ref 5–40)

## 2018-04-16 ENCOUNTER — Ambulatory Visit (HOSPITAL_BASED_OUTPATIENT_CLINIC_OR_DEPARTMENT_OTHER): Payer: Commercial Managed Care - PPO | Attending: Family Medicine | Admitting: Internal Medicine

## 2018-04-16 DIAGNOSIS — G4733 Obstructive sleep apnea (adult) (pediatric): Secondary | ICD-10-CM

## 2018-04-16 DIAGNOSIS — R0683 Snoring: Secondary | ICD-10-CM

## 2018-04-21 ENCOUNTER — Ambulatory Visit: Payer: Commercial Managed Care - PPO | Admitting: Family Medicine

## 2018-04-28 ENCOUNTER — Ambulatory Visit: Payer: Commercial Managed Care - PPO | Admitting: Family Medicine

## 2018-04-28 ENCOUNTER — Encounter: Payer: Self-pay | Admitting: Family Medicine

## 2018-04-28 VITALS — BP 136/88 | HR 58 | Temp 97.6°F | Ht 69.0 in | Wt 256.4 lb

## 2018-04-28 DIAGNOSIS — E669 Obesity, unspecified: Secondary | ICD-10-CM | POA: Diagnosis not present

## 2018-04-28 DIAGNOSIS — R0683 Snoring: Secondary | ICD-10-CM | POA: Diagnosis not present

## 2018-04-28 NOTE — Progress Notes (Signed)
   Subjective:    Patient ID: Garrett Holmes, male    DOB: October 14, 1966, 52 y.o.   MRN: [id number]  HPI He is here for consult concerning recent sleep study.  The sleep study final report is not and.   Review of Systems     Objective:   Physical Exam Alert and in no distress otherwise not examined       Assessment & Plan:  Snoring  Obesity (BMI 30-39.9) Explained to her that if the study comes back positive, I will refer him to get a CPAP and we will do auto titrate.  Explained that we would then need to follow-up on that roughly 1 month after to make sure it is working and that he is getting some benefit from this.  I then discussed the fact that more the primary therapy is his weight reduction.  I again then discussed diet in terms of cutting back on carbohydrates as well as his physical activity of at least 20 minutes of something physical on a daily basis.  Reinforced the need to do this for general health reasons as well as possible sleep apnea. I discussed the parameters of the sleep study with him.

## 2018-04-30 ENCOUNTER — Encounter: Payer: Self-pay | Admitting: Family Medicine

## 2018-05-04 DIAGNOSIS — G4733 Obstructive sleep apnea (adult) (pediatric): Secondary | ICD-10-CM | POA: Insufficient documentation

## 2018-05-04 DIAGNOSIS — R0683 Snoring: Secondary | ICD-10-CM | POA: Insufficient documentation

## 2018-05-04 NOTE — Progress Notes (Signed)
   Subjective:    Patient ID: Garrett Holmes, male    DOB: 1966/12/05, 52 y.o.   MRN: [id number]  HPI no   Review of Systems     Objective:   Physical Exam ooo       Assessment & Plan:  OSA (obstructive sleep apnea)  Snoring - Plan: Home sleep test

## 2018-05-10 DIAGNOSIS — G4733 Obstructive sleep apnea (adult) (pediatric): Secondary | ICD-10-CM

## 2018-05-10 NOTE — Procedures (Signed)
   Patient Name: Garrett Holmes, Garrett Holmes Date: 04/16/2018 Gender: Male D.O.B: 02-28-1966 Age (years): 88 Referring Provider: Denita Lung Height (inches): 50 Interpreting Physician: Baird Lyons MD, ABSM Weight (lbs): 245 RPSGT: Gerhard Perches BMI: 37 MRN: [id number] Neck Size: 19.50 CLINICAL INFORMATION Sleep Study Type: HST Indication for sleep study: Snoring  Epworth Sleepiness Score: 4  SLEEP STUDY TECHNIQUE A multi-channel overnight portable sleep study was performed. The channels recorded were: nasal airflow, thoracic respiratory movement, and oxygen saturation with a pulse oximetry. Snoring was also monitored.  MEDICATIONS Patient self administered medications include: none reported.  SLEEP ARCHITECTURE Patient was studied for 395.2 minutes. The sleep efficiency was 100.0 % and the patient was supine for 33.5%. The arousal index was 0.0 per hour.  RESPIRATORY PARAMETERS The overall AHI was 63.3 per hour, with a central apnea index of 0.0 per hour.  The oxygen nadir was 67% during sleep.  CARDIAC DATA Mean heart rate during sleep was 71.8 bpm.  IMPRESSIONS - Severe obstructive sleep apnea occurred during this study (AHI = 63.3/h). - No significant central sleep apnea occurred during this study (CAI = 0.0/h). - Oxygen desaturation was noted during this study (Min O2 = 67%). - Patient snored.  DIAGNOSIS - Obstructive Sleep Apnea (327.23 [G47.33 ICD-10]) - Nocturnal Hypoxemia (327.26 [G47.36 ICD-10])  RECOMMENDATIONS - Suggest CPAP titration sleep study or DME autopap. Other options would be based on clinical judgment. - Avoid alcohol, sedatives and other CNS depressants that may worsen sleep apnea and disrupt normal sleep architecture. - Sleep hygiene should be reviewed to assess factors that may improve sleep quality. - Weight management and regular exercise should be initiated or continued.  [Electronically signed] 05/10/2018 10:17 AM  Baird Lyons MD,  ABSM Diplomate, American Board of Sleep Medicine   NPI: [id number]                          [place] [place] [facility]  ELECTRONICALLY SIGNED ON:  05/10/2018, 10:15 AM [place] PH: [phone]   FX: [phone] [place]

## 2018-05-13 ENCOUNTER — Other Ambulatory Visit: Payer: Self-pay

## 2018-05-13 DIAGNOSIS — G4733 Obstructive sleep apnea (adult) (pediatric): Secondary | ICD-10-CM

## 2018-05-22 DIAGNOSIS — G4733 Obstructive sleep apnea (adult) (pediatric): Secondary | ICD-10-CM | POA: Diagnosis not present

## 2018-06-21 DIAGNOSIS — G4733 Obstructive sleep apnea (adult) (pediatric): Secondary | ICD-10-CM | POA: Diagnosis not present

## 2018-06-23 ENCOUNTER — Encounter: Payer: Self-pay | Admitting: Family Medicine

## 2018-06-23 ENCOUNTER — Ambulatory Visit: Payer: Commercial Managed Care - PPO | Admitting: Family Medicine

## 2018-06-23 VITALS — BP 124/80 | HR 61 | Temp 98.0°F | Wt 258.8 lb

## 2018-06-23 DIAGNOSIS — G4733 Obstructive sleep apnea (adult) (pediatric): Secondary | ICD-10-CM

## 2018-06-23 NOTE — Progress Notes (Signed)
   Subjective:    Patient ID: Garrett Holmes, male    DOB: 10-30-1966, 52 y.o.   MRN: [id number]  HPI He is here for recheck on sleep he has been using the CPAP now for over a month.  Compliance report shows 100% usage in the 100% of days greater than 4 hours.  He does state that he has noted a definite improvement in his healing more energy and less fatigue.  He is very happy with this.   Review of Systems     Objective:   Physical Exam Alert and in no distress.  The complaints port was recorded as mentioned above.  According to the report is CPAP auto be set at around 12.       Assessment & Plan:  OSA (obstructive sleep apnea)  I will have him continue on the CPAP.  It should be set around 12.

## 2018-07-01 DIAGNOSIS — M722 Plantar fascial fibromatosis: Secondary | ICD-10-CM | POA: Diagnosis not present

## 2018-07-01 DIAGNOSIS — M7732 Calcaneal spur, left foot: Secondary | ICD-10-CM | POA: Diagnosis not present

## 2018-07-08 DIAGNOSIS — M722 Plantar fascial fibromatosis: Secondary | ICD-10-CM | POA: Diagnosis not present

## 2018-07-15 DIAGNOSIS — M722 Plantar fascial fibromatosis: Secondary | ICD-10-CM | POA: Diagnosis not present

## 2018-07-22 DIAGNOSIS — G4733 Obstructive sleep apnea (adult) (pediatric): Secondary | ICD-10-CM | POA: Diagnosis not present

## 2018-07-24 DIAGNOSIS — G4733 Obstructive sleep apnea (adult) (pediatric): Secondary | ICD-10-CM | POA: Diagnosis not present

## 2018-08-04 DIAGNOSIS — M722 Plantar fascial fibromatosis: Secondary | ICD-10-CM | POA: Diagnosis not present

## 2018-08-22 DIAGNOSIS — G4733 Obstructive sleep apnea (adult) (pediatric): Secondary | ICD-10-CM | POA: Diagnosis not present

## 2018-11-03 DIAGNOSIS — G4733 Obstructive sleep apnea (adult) (pediatric): Secondary | ICD-10-CM | POA: Diagnosis not present

## 2018-11-21 DIAGNOSIS — G4733 Obstructive sleep apnea (adult) (pediatric): Secondary | ICD-10-CM | POA: Diagnosis not present

## 2018-12-03 DIAGNOSIS — G4733 Obstructive sleep apnea (adult) (pediatric): Secondary | ICD-10-CM | POA: Diagnosis not present

## 2018-12-22 DIAGNOSIS — G4733 Obstructive sleep apnea (adult) (pediatric): Secondary | ICD-10-CM | POA: Diagnosis not present

## 2019-01-02 DIAGNOSIS — G4733 Obstructive sleep apnea (adult) (pediatric): Secondary | ICD-10-CM | POA: Diagnosis not present

## 2019-02-02 DIAGNOSIS — G4733 Obstructive sleep apnea (adult) (pediatric): Secondary | ICD-10-CM | POA: Diagnosis not present

## 2019-02-20 DIAGNOSIS — G4733 Obstructive sleep apnea (adult) (pediatric): Secondary | ICD-10-CM | POA: Diagnosis not present

## 2019-03-23 ENCOUNTER — Encounter: Payer: Commercial Managed Care - PPO | Admitting: Family Medicine

## 2019-04-05 ENCOUNTER — Other Ambulatory Visit: Payer: Self-pay | Admitting: Family Medicine

## 2019-04-05 DIAGNOSIS — E785 Hyperlipidemia, unspecified: Secondary | ICD-10-CM

## 2019-04-16 ENCOUNTER — Encounter: Payer: Self-pay | Admitting: Family Medicine

## 2019-09-07 NOTE — Progress Notes (Deleted)
   Subjective:    Patient ID: Garrett Holmes, male    DOB: 1966/08/09, 53 y.o.   MRN: [id number]  HPI:  Garrett Holmes is here to establish as a new pt.  He is a pleasant 53 year old male. PMH:  Obesity, HLD, OSA, Constipation,   Last fasting labs 03/2018 Patient Care Team    Relationship Specialty Notifications Start End  Denita Lung, MD PCP - General Family Medicine  03/20/11     Patient Active Problem List   Diagnosis Date Noted  . OSA (obstructive sleep apnea) 05/04/2018  . Snoring 05/04/2018  . Former smoker 03/13/2016  . Constipation 03/13/2016  . Other allergic rhinitis 03/13/2016  . Hyperlipidemia 05/22/2011  . Obesity (BMI 30-39.9) 05/22/2011     Past Medical History:  Diagnosis Date  . Allergy   . Dyslipidemia   . Hyperlipidemia      Past Surgical History:  Procedure Laterality Date  . COLONOSCOPY  2012   HUNG     Family History  Problem Relation Age of Onset  . Cancer Mother 67       Renal cancer     Social History   Substance and Sexual Activity  Drug Use Yes  . Types: Morphine     Social History   Substance and Sexual Activity  Alcohol Use Yes  . Alcohol/week: 1.0 standard drinks  . Types: 1 Cans of beer per week     Social History   Tobacco Use  Smoking Status Former Smoker  . Types: Cigarettes  . Quit date: 12/17/2010  . Years since quitting: 8.7  Smokeless Tobacco Never Used     Outpatient Encounter Medications as of 09/08/2019  Medication Sig  . ALPRAZolam (XANAX) 0.25 MG tablet Take 1 tablet (0.25 mg total) by mouth 2 (two) times daily as needed for anxiety.  Marland Kitchen atorvastatin (LIPITOR) 40 MG tablet TAKE 1 TABLET BY MOUTH EVERY DAY  . escitalopram (LEXAPRO) 10 MG tablet Take 1 tablet (10 mg total) by mouth daily.  . fluconazole (DIFLUCAN) 100 MG tablet Take 1 tablet (100 mg total) by mouth daily.  . Linaclotide (LINZESS) 145 MCG CAPS capsule Take 145 mcg by mouth daily.  . [DISCONTINUED] metoprolol succinate (TOPROL-XL) 25 MG 24  hr tablet Take 1 tablet (25 mg total) by mouth daily.   No facility-administered encounter medications on file as of 09/08/2019.     Allergies: Sudafed [pseudoephedrine hcl]  There is no height or weight on file to calculate BMI.  There were no vitals taken for this visit.     Review of Systems     Objective:   Physical Exam        Assessment & Plan:  No diagnosis found.  No problem-specific Assessment & Plan notes found for this encounter.    FOLLOW-UP:  No follow-ups on file.

## 2019-09-08 ENCOUNTER — Ambulatory Visit: Payer: Commercial Managed Care - PPO | Admitting: Adult Health

## 2019-09-14 ENCOUNTER — Ambulatory Visit: Payer: Commercial Managed Care - PPO | Admitting: Adult Health

## 2019-09-14 ENCOUNTER — Encounter: Payer: Self-pay | Admitting: Adult Health

## 2019-09-14 ENCOUNTER — Other Ambulatory Visit: Payer: Self-pay

## 2019-09-14 VITALS — BP 132/81 | HR 84 | Temp 98.7°F | Ht 68.25 in | Wt 263.3 lb

## 2019-09-14 DIAGNOSIS — E669 Obesity, unspecified: Secondary | ICD-10-CM

## 2019-09-14 DIAGNOSIS — K5900 Constipation, unspecified: Secondary | ICD-10-CM

## 2019-09-14 DIAGNOSIS — Z1211 Encounter for screening for malignant neoplasm of colon: Secondary | ICD-10-CM

## 2019-09-14 DIAGNOSIS — E785 Hyperlipidemia, unspecified: Secondary | ICD-10-CM | POA: Diagnosis not present

## 2019-09-14 DIAGNOSIS — F411 Generalized anxiety disorder: Secondary | ICD-10-CM

## 2019-09-14 DIAGNOSIS — Z0000 Encounter for general adult medical examination without abnormal findings: Secondary | ICD-10-CM | POA: Insufficient documentation

## 2019-09-14 DIAGNOSIS — G4733 Obstructive sleep apnea (adult) (pediatric): Secondary | ICD-10-CM

## 2019-09-14 MED ORDER — ATORVASTATIN CALCIUM 40 MG PO TABS: 40.0000 mg | ORAL_TABLET | Freq: Every day | ORAL | 0 refills | Status: DC

## 2019-09-14 MED ORDER — FLUOXETINE HCL 20 MG PO TABS: ORAL_TABLET | ORAL | 0 refills | Status: DC

## 2019-09-14 NOTE — Assessment & Plan Note (Signed)
The 10-year ASCVD risk score Mikey Bussing DC Brooke Bonito., et al., 2013) is: 4.7%   Values used to calculate the score:     Age: 53 years     Sex: Male     Is Non-Hispanic African American: No     Diabetic: No     Tobacco smoker: No     Systolic Blood Pressure: Q000111Q mmHg     Is BP treated: No     HDL Cholesterol: 43 mg/dL     Total Cholesterol: 200 mg/dL  Currently on Atorvastatin 40mg  QD Will obtain fasting labs at /fu in 4 weeks

## 2019-09-14 NOTE — Assessment & Plan Note (Signed)
Linzess 111mcg 1-3 times/week

## 2019-09-14 NOTE — Patient Instructions (Addendum)
Mediterranean Diet A Mediterranean diet refers to food and lifestyle choices that are based on the traditions of countries located on the [employer]. This way of eating has been shown to help prevent certain conditions and improve outcomes for people who have chronic diseases, like kidney disease and heart disease. What are tips for following this plan? Lifestyle  Cook and eat meals together with your family, when possible.  Drink enough fluid to keep your urine clear or pale yellow.  Be physically active every day. This includes: ? Aerobic exercise like running or swimming. ? Leisure activities like gardening, walking, or housework.  Get 7-8 hours of sleep each night.  If recommended by your health care provider, drink red wine in moderation. This means 1 glass a day for nonpregnant women and 2 glasses a day for men. A glass of wine equals 5 oz (150 mL). Reading food labels   Check the serving size of packaged foods. For foods such as rice and pasta, the serving size refers to the amount of cooked product, not dry.  Check the total fat in packaged foods. Avoid foods that have saturated fat or trans fats.  Check the ingredients list for added sugars, such as corn syrup. Shopping  At the grocery store, buy most of your food from the areas near the walls of the store. This includes: ? Fresh fruits and vegetables (produce). ? Grains, beans, nuts, and seeds. Some of these may be available in unpackaged forms or large amounts (in bulk). ? Fresh seafood. ? Poultry and eggs. ? Low-fat dairy products.  Buy whole ingredients instead of prepackaged foods.  Buy fresh fruits and vegetables in-season from local farmers markets.  Buy frozen fruits and vegetables in resealable bags.  If you do not have access to quality fresh seafood, buy precooked frozen shrimp or canned fish, such as tuna, salmon, or sardines.  Buy small amounts of raw or cooked vegetables, salads, or olives from  the deli or salad bar at your store.  Stock your pantry so you always have certain foods on hand, such as olive oil, canned tuna, canned tomatoes, rice, pasta, and beans. Cooking  Cook foods with extra-virgin olive oil instead of using butter or other vegetable oils.  Have meat as a side dish, and have vegetables or grains as your main dish. This means having meat in small portions or adding small amounts of meat to foods like pasta or stew.  Use beans or vegetables instead of meat in common dishes like chili or lasagna.  Experiment with different cooking methods. Try roasting or broiling vegetables instead of steaming or sauteing them.  Add frozen vegetables to soups, stews, pasta, or rice.  Add nuts or seeds for added healthy fat at each meal. You can add these to yogurt, salads, or vegetable dishes.  Marinate fish or vegetables using olive oil, lemon juice, garlic, and fresh herbs. Meal planning   Plan to eat 1 vegetarian meal one day each week. Try to work up to 2 vegetarian meals, if possible.  Eat seafood 2 or more times a week.  Have healthy snacks readily available, such as: ? Vegetable sticks with hummus. ? [place] yogurt. ? Fruit and nut trail mix.  Eat balanced meals throughout the week. This includes: ? Fruit: 2-3 servings a day ? Vegetables: 4-5 servings a day ? Low-fat dairy: 2 servings a day ? Fish, poultry, or lean meat: 1 serving a day ? Beans and legumes: 2 or more servings a week ?  Nuts and seeds: 1-2 servings a day ? Whole grains: 6-8 servings a day ? Extra-virgin olive oil: 3-4 servings a day  Limit red meat and sweets to only a few servings a month What are my food choices?  Mediterranean diet ? Recommended  Grains: Whole-grain pasta. Brown rice. Bulgar wheat. Polenta. Couscous. Whole-wheat bread. [place].  Vegetables: Artichokes. Beets. Broccoli. Cabbage. Carrots. Eggplant. Green beans. Chard. Kale. Spinach. Onions. Leeks. Peas. Squash.  Tomatoes. Peppers. Radishes.  Fruits: Apples. Apricots. Avocado. Berries. Bananas. Cherries. Dates. Figs. Grapes. Lemons. Melon. Oranges. Peaches. Plums. Pomegranate.  Meats and other protein foods: Beans. Almonds. Sunflower seeds. Pine nuts. Peanuts. [place]. Salmon. Scallops. Shrimp. [place]. Tilapia. Clams. Oysters. Eggs.  Dairy: Low-fat milk. Cheese. Greek yogurt.  Beverages: Water. Red wine. Herbal tea.  Fats and oils: Extra virgin olive oil. Avocado oil. Grape seed oil.  Sweets and desserts: [place] yogurt with honey. Baked apples. Poached pears. Trail mix.  Seasoning and other foods: Basil. Cilantro. Coriander. Cumin. Mint. Parsley. Sage. Rosemary. Tarragon. Garlic. Oregano. Thyme. Pepper. Balsalmic vinegar. Tahini. Hummus. Tomato sauce. Olives. Mushrooms. ? Limit these  Grains: Prepackaged pasta or rice dishes. Prepackaged cereal with added sugar.  Vegetables: Deep fried potatoes (french fries).  Fruits: Fruit canned in syrup.  Meats and other protein foods: Beef. Pork. Lamb. Poultry with skin. Hot dogs. [place] Salines.  Dairy: Ice cream. Sour cream. Whole milk.  Beverages: Juice. Sugar-sweetened soft drinks. Beer. Liquor and spirits.  Fats and oils: Butter. Canola oil. Vegetable oil. Beef fat (tallow). Lard.  Sweets and desserts: Cookies. Cakes. Pies. Candy.  Seasoning and other foods: Mayonnaise. Premade sauces and marinades. The items listed may not be a complete list. Talk with your dietitian about what dietary choices are right for you. Summary  The Mediterranean diet includes both food and lifestyle choices.  Eat a variety of fresh fruits and vegetables, beans, nuts, seeds, and whole grains.  Limit the amount of red meat and sweets that you eat.  Talk with your health care provider about whether it is safe for you to drink red wine in moderation. This means 1 glass a day for nonpregnant women and 2 glasses a day for men. A glass of wine equals 5 oz (150 mL). This information  is not intended to replace advice given to you by your health care provider. Make sure you discuss any questions you have with your health care provider. Document Released: 07/26/2016 Document Revised: 08/02/2016 Document Reviewed: 07/26/2016 Elsevier Patient Education  [place].   Medications refilled. Remain iff Escitalopram (Lexapro) and start Fluoxtine (Prozac) 20mg - 1/2 tablet for one week, then increase to one full tablet. Continue to use Alprazolam (Xanax) 0.25mg  every sparingly. Increase plain water intake- strive for at least half of your body weight in ounces/day. Follow Mediterranean diet. Walk daily- strive for at least 10 mins. Referral to Gastroenterology/Dr. Benson Norway placed, re: colonoscopy. Please schedule follow-up in 4 weeks, come fasting. Continue to social distance and wear a mask when in public. WELCOME TO THE PRACTICE!

## 2019-09-14 NOTE — Assessment & Plan Note (Addendum)
Body mass index is 39.74 kg/m. Current wt 263 Mediterranean diet Walk 48mins/day

## 2019-09-14 NOTE — Progress Notes (Signed)
Subjective:    Patient ID: Garrett Holmes, male    DOB: August 11, 1966, 53 y.o.   MRN: [id number]  HPI:  Garrett Holmes is here to establish as a new pt.  He is a pleasant 53 year old male.   PMH: HLD, OSA, Obesity, Constipation He denies being on Metoprolol for HTN-however Rx listed in system-last filled 2013 Body mass index is 39.74 kg/m.  Current wt 263 He reports 100% compliance with nightly CPAP He denies regular exercise- diet high in saturated fat/CHO GAD- previously on Escitalopram- he stopped >2 months ago- felt that caused weight gain and fatigue- would like to try another SSRI He reports taking Alprazolam 0.25mg  1-2 tablets per month [place] Controlled Substance Database reviewed- no refills in 2019 for benzo He reports difficultly falling asleep- discussed Sleep Hygiene and increasing regular exercise He quite tobacco use >10 years ago He drinks 6-12 Bud Lights on Friday and Saturday evenings- none other days of week He is currently on Atorvastatin, denies myalgia's  Patient Care Team    Relationship Specialty Notifications Start End  Esaw Grandchild, NP PCP - General Family Medicine  09/14/19     Patient Active Problem List   Diagnosis Date Noted  . OSA (obstructive sleep apnea) 05/04/2018  . Snoring 05/04/2018  . Former smoker 03/13/2016  . Constipation 03/13/2016  . Other allergic rhinitis 03/13/2016  . Hyperlipidemia 05/22/2011  . Obesity (BMI 30-39.9) 05/22/2011     Past Medical History:  Diagnosis Date  . Allergy   . Anxiety   . Depression   . Dyslipidemia   . Hyperlipidemia      Past Surgical History:  Procedure Laterality Date  . COLONOSCOPY  2012   [facility]G     Family History  Problem Relation Age of Onset  . Cancer Mother 75       Renal cancer     Social History   Substance and Sexual Activity  Drug Use Not Currently  . Types: Morphine     Social History   Substance and Sexual Activity  Alcohol Use Yes  . Alcohol/week: 1.0  standard drinks  . Types: 1 Cans of beer per week     Social History   Tobacco Use  Smoking Status Former Smoker  . Types: Cigarettes  . Quit date: 12/17/2010  . Years since quitting: 8.7  Smokeless Tobacco Never Used     Outpatient Encounter Medications as of 09/14/2019  Medication Sig  . ALPRAZolam (XANAX) 0.25 MG tablet Take 1 tablet (0.25 mg total) by mouth 2 (two) times daily as needed for anxiety.  Marland Kitchen atorvastatin (LIPITOR) 40 MG tablet TAKE 1 TABLET BY MOUTH EVERY DAY  . escitalopram (LEXAPRO) 10 MG tablet Take 1 tablet (10 mg total) by mouth daily.  . Linaclotide (LINZESS) 145 MCG CAPS capsule Take 145 mcg by mouth daily.  . [DISCONTINUED] fluconazole (DIFLUCAN) 100 MG tablet Take 1 tablet (100 mg total) by mouth daily.  . [DISCONTINUED] metoprolol succinate (TOPROL-XL) 25 MG 24 hr tablet Take 1 tablet (25 mg total) by mouth daily.   No facility-administered encounter medications on file as of 09/14/2019.     Allergies: Sudafed [pseudoephedrine hcl]  Body mass index is 39.74 kg/m.  Blood pressure 132/81, pulse 84, temperature 98.7 F (37.1 C), temperature source Oral, height 5' 8.25" (1.734 m), weight 263 lb 4.8 oz (119.4 kg), SpO2 98 %.   Review of Systems  Constitutional: Positive for fatigue. Negative for activity change, appetite change, chills, diaphoresis, fever  and unexpected weight change.  Eyes: Negative for visual disturbance.  Respiratory: Negative for cough, chest tightness, shortness of breath, wheezing and stridor.   Cardiovascular: Negative for chest pain, palpitations and leg swelling.  Gastrointestinal: Positive for constipation. Negative for abdominal distention, anal bleeding, blood in stool, diarrhea, nausea and vomiting.  Endocrine: Negative for cold intolerance, heat intolerance, polydipsia and polyphagia.  Genitourinary: Negative for difficulty urinating and flank pain.  Musculoskeletal: Negative for arthralgias, back pain, gait problem, joint  swelling, myalgias, neck pain and neck stiffness.  Skin: Negative for color change, pallor, rash and wound.  Neurological: Negative for dizziness and headaches.  Hematological: Negative for adenopathy. Does not bruise/bleed easily.  Psychiatric/Behavioral: Positive for sleep disturbance. Negative for agitation, behavioral problems, confusion, decreased concentration, dysphoric mood, hallucinations, self-injury and suicidal ideas. The patient is nervous/anxious. The patient is not hyperactive.        Objective:   Physical Exam Vitals signs and nursing note reviewed.  Constitutional:      General: He is not in acute distress.    Appearance: He is obese. He is not ill-appearing.  HENT:     Head: Normocephalic and atraumatic.  Eyes:     Extraocular Movements: Extraocular movements intact.     Conjunctiva/sclera: Conjunctivae normal.     Pupils: Pupils are equal, round, and reactive to light.  Cardiovascular:     Rate and Rhythm: Normal rate and regular rhythm.     Pulses: Normal pulses.     Heart sounds: Normal heart sounds. No murmur. No friction rub. No gallop.   Pulmonary:     Effort: Pulmonary effort is normal. No respiratory distress.     Breath sounds: Normal breath sounds. No stridor. No wheezing, rhonchi or rales.  Chest:     Chest wall: No tenderness.  Skin:    Capillary Refill: Capillary refill takes less than 2 seconds.  Neurological:     Mental Status: He is alert and oriented to person, place, and time.  Psychiatric:        Mood and Affect: Mood normal.        Behavior: Behavior normal.        Thought Content: Thought content normal.        Judgment: Judgment normal.       Assessment & Plan:   1. Hyperlipidemia, unspecified hyperlipidemia type   2. Encounter for screening colonoscopy   3. Healthcare maintenance   4. OSA (obstructive sleep apnea)   5. Obesity (BMI 30-39.9)   6. Constipation, unspecified constipation type   7. GAD (generalized anxiety disorder)      Healthcare maintenance  Medications refilled. Remain iff Escitalopram (Lexapro) and start Fluoxtine (Prozac) 20mg - 1/2 tablet for one week, then increase to one full tablet. Continue to use Alprazolam (Xanax) 0.25mg  every sparingly. Increase plain water intake- strive for at least half of your body weight in ounces/day. Follow Mediterranean diet. Walk daily- strive for at least 10 mins. Referral to Gastroenterology/Dr. Benson Norway placed, re: colonoscopy. Please schedule follow-up in 4 weeks, come fasting. Continue to social distance and wear a mask when in public.  Hyperlipidemia The 10-year ASCVD risk score Mikey Bussing DC Jr., et al., 2013) is: 4.7%   Values used to calculate the score:     Age: 78 years     Sex: Male     Is Non-Hispanic African American: No     Diabetic: No     Tobacco smoker: No     Systolic Blood Pressure: Q000111Q mmHg  Is BP treated: No     HDL Cholesterol: 43 mg/dL     Total Cholesterol: 200 mg/dL  Currently on Atorvastatin 40mg  QD Will obtain fasting labs at /fu in 4 weeks  OSA (obstructive sleep apnea) Compliant on nightly CPAP  Obesity (BMI 30-39.9) Body mass index is 39.74 kg/m. Current wt 263 Mediterranean diet Walk 5mins/day   Constipation Linzess 161mcg 1-3 times/week    FOLLOW-UP:  Return in about 4 weeks (around 10/12/2019) for Regular Follow Up, Evaluate Medication Effectiveness, General Anxiety Disorder.

## 2019-09-14 NOTE — Assessment & Plan Note (Signed)
Compliant on nightly CPAP

## 2019-09-14 NOTE — Assessment & Plan Note (Signed)
Medications refilled. Remain iff Escitalopram (Lexapro) and start Fluoxtine (Prozac) 20mg - 1/2 tablet for one week, then increase to one full tablet. Continue to use Alprazolam (Xanax) 0.25mg  every sparingly. Increase plain water intake- strive for at least half of your body weight in ounces/day. Follow Mediterranean diet. Walk daily- strive for at least 10 mins. Referral to Gastroenterology/Dr. Benson Norway placed, re: colonoscopy. Please schedule follow-up in 4 weeks, come fasting. Continue to social distance and wear a mask when in public.

## 2019-09-15 ENCOUNTER — Telehealth: Payer: Self-pay | Admitting: Adult Health

## 2019-09-15 MED ORDER — FLUOXETINE HCL 20 MG PO CAPS: 20.0000 mg | ORAL_CAPSULE | Freq: Every day | ORAL | 0 refills | Status: DC

## 2019-09-15 MED ORDER — FLUOXETINE HCL 10 MG PO CAPS: 10.0000 mg | ORAL_CAPSULE | Freq: Every day | ORAL | 0 refills | Status: DC

## 2019-09-15 NOTE — Telephone Encounter (Signed)
Called [facility] and cancelled fluoxetine RX at their pharmacy with [place].  RXs for 10mg  capsules and 20mg  capsules sent to [facility] per pt request.  Pt informed.  Charyl Bigger, CMA

## 2019-09-15 NOTE — Telephone Encounter (Signed)
Patient called states wants to switch pharmacy to [facility] on [address], [place] also says pill form needs to be changed (to Capsule from Tablet).  --forwarding request to medical assistant, if questions pls call pt @ [phone].  -glh

## 2019-10-12 ENCOUNTER — Other Ambulatory Visit: Payer: Self-pay

## 2019-10-12 ENCOUNTER — Encounter: Payer: Self-pay | Admitting: Adult Health

## 2019-10-12 ENCOUNTER — Ambulatory Visit: Payer: Commercial Managed Care - PPO | Admitting: Adult Health

## 2019-10-12 DIAGNOSIS — I10 Essential (primary) hypertension: Secondary | ICD-10-CM | POA: Insufficient documentation

## 2019-10-12 DIAGNOSIS — G4733 Obstructive sleep apnea (adult) (pediatric): Secondary | ICD-10-CM

## 2019-10-12 DIAGNOSIS — E669 Obesity, unspecified: Secondary | ICD-10-CM

## 2019-10-12 DIAGNOSIS — F411 Generalized anxiety disorder: Secondary | ICD-10-CM

## 2019-10-12 DIAGNOSIS — Z0000 Encounter for general adult medical examination without abnormal findings: Secondary | ICD-10-CM

## 2019-10-12 DIAGNOSIS — E785 Hyperlipidemia, unspecified: Secondary | ICD-10-CM

## 2019-10-12 DIAGNOSIS — R030 Elevated blood-pressure reading, without diagnosis of hypertension: Secondary | ICD-10-CM

## 2019-10-12 NOTE — Assessment & Plan Note (Signed)
Lipid panel drawn today 

## 2019-10-12 NOTE — Patient Instructions (Addendum)
Generalized Anxiety Disorder, Adult Generalized anxiety disorder (GAD) is a mental health disorder. People with this condition constantly worry about everyday events. Unlike normal anxiety, worry related to GAD is not triggered by a specific event. These worries also do not fade or get better with time. GAD interferes with life functions, including relationships, work, and school. GAD can vary from mild to severe. People with severe GAD can have intense waves of anxiety with physical symptoms (panic attacks). What are the causes? The exact cause of GAD is not known. What increases the risk? This condition is more likely to develop in:  Women.  People who have a family history of anxiety disorders.  People who are very shy.  People who experience very stressful life events, such as the death of a loved one.  People who have a very stressful family environment. What are the signs or symptoms? People with GAD often worry excessively about many things in their lives, such as their health and family. They may also be overly concerned about:  Doing well at work.  Being on time.  Natural disasters.  Friendships. Physical symptoms of GAD include:  Fatigue.  Muscle tension or having muscle twitches.  Trembling or feeling shaky.  Being easily startled.  Feeling like your heart is pounding or racing.  Feeling out of breath or like you cannot take a deep breath.  Having trouble falling asleep or staying asleep.  Sweating.  Nausea, diarrhea, or irritable bowel syndrome (IBS).  Headaches.  Trouble concentrating or remembering facts.  Restlessness.  Irritability. How is this diagnosed? Your health care provider can diagnose GAD based on your symptoms and medical history. You will also have a physical exam. The health care provider will ask specific questions about your symptoms, including how severe they are, when they started, and if they come and go. Your health care  provider may ask you about your use of alcohol or drugs, including prescription medicines. Your health care provider may refer you to a mental health specialist for further evaluation. Your health care provider will do a thorough examination and may perform additional tests to rule out other possible causes of your symptoms. To be diagnosed with GAD, a person must have anxiety that:  Is out of his or her control.  Affects several different aspects of his or her life, such as work and relationships.  Causes distress that makes him or her unable to take part in normal activities.  Includes at least three physical symptoms of GAD, such as restlessness, fatigue, trouble concentrating, irritability, muscle tension, or sleep problems. Before your health care provider can confirm a diagnosis of GAD, these symptoms must be present more days than they are not, and they must last for six months or longer. How is this treated? The following therapies are usually used to treat GAD:  Medicine. Antidepressant medicine is usually prescribed for long-term daily control. Antianxiety medicines may be added in severe cases, especially when panic attacks occur.  Talk therapy (psychotherapy). Certain types of talk therapy can be helpful in treating GAD by providing support, education, and guidance. Options include: ? Cognitive behavioral therapy (CBT). People learn coping skills and techniques to ease their anxiety. They learn to identify unrealistic or negative thoughts and behaviors and to replace them with positive ones. ? Acceptance and commitment therapy (ACT). This treatment teaches people how to be mindful as a way to cope with unwanted thoughts and feelings. ? Biofeedback. This process trains you to manage your body's response (  physiological response) through breathing techniques and relaxation methods. You will work with a therapist while machines are used to monitor your physical symptoms.  Stress  management techniques. These include yoga, meditation, and exercise. A mental health specialist can help determine which treatment is best for you. Some people see improvement with one type of therapy. However, other people require a combination of therapies. Follow these instructions at home:  Take over-the-counter and prescription medicines only as told by your health care provider.  Try to maintain a normal routine.  Try to anticipate stressful situations and allow extra time to manage them.  Practice any stress management or self-calming techniques as taught by your health care provider.  Do not punish yourself for setbacks or for not making progress.  Try to recognize your accomplishments, even if they are small.  Keep all follow-up visits as told by your health care provider. This is important. Contact a health care provider if:  Your symptoms do not get better.  Your symptoms get worse.  You have signs of depression, such as: ? A persistently sad, cranky, or irritable mood. ? Loss of enjoyment in activities that used to bring you joy. ? Change in weight or eating. ? Changes in sleeping habits. ? Avoiding friends or family members. ? Loss of energy for normal tasks. ? Feelings of guilt or worthlessness. Get help right away if:  You have serious thoughts about hurting yourself or others. If you ever feel like you may hurt yourself or others, or have thoughts about taking your own life, get help right away. You can go to your nearest emergency department or call:  Your local emergency services (911 in the [place]  A suicide crisis helpline, such as the [facility] at (219)550-4693. This is open 24 hours a day. Summary  Generalized anxiety disorder (GAD) is a mental health disorder that involves worry that is not triggered by a specific event.  People with GAD often worry excessively about many things in their lives, such as their health and  family.  GAD may cause physical symptoms such as restlessness, trouble concentrating, sleep problems, frequent sweating, nausea, diarrhea, headaches, and trembling or muscle twitching.  A mental health specialist can help determine which treatment is best for you. Some people see improvement with one type of therapy. However, other people require a combination of therapies. This information is not intended to replace advice given to you by your health care provider. Make sure you discuss any questions you have with your health care provider. Document Released: 03/30/2013 Document Revised: 11/15/2017 Document Reviewed: 10/23/2016 Elsevier Patient Education  2020 [place] Your Hypertension Hypertension is commonly called high blood pressure. This is when the force of your blood pressing against the walls of your arteries is too strong. Arteries are blood vessels that carry blood from your heart throughout your body. Hypertension forces the heart to work harder to pump blood, and may cause the arteries to become narrow or stiff. Having untreated or uncontrolled hypertension can cause heart attack, stroke, kidney disease, and other problems. What are blood pressure readings? A blood pressure reading consists of a higher number over a lower number. Ideally, your blood pressure should be below 120/80. The first ("top") number is called the systolic pressure. It is a measure of the pressure in your arteries as your heart beats. The second ("bottom") number is called the diastolic pressure. It is a measure of the pressure in your arteries as the heart  relaxes. What does my blood pressure reading mean? Blood pressure is classified into four stages. Based on your blood pressure reading, your health care provider may use the following stages to determine what type of treatment you need, if any. Systolic pressure and diastolic pressure are measured in a unit called mm Hg. Normal  Systolic  pressure: below 120.  Diastolic pressure: below 80. Elevated  Systolic pressure: [id number].  Diastolic pressure: below 80. Hypertension stage 1  Systolic pressure: 0000000.  Diastolic pressure: [id number]. Hypertension stage 2  Systolic pressure: [id number] or above.  Diastolic pressure: 90 or above. What health risks are associated with hypertension? Managing your hypertension is an important responsibility. Uncontrolled hypertension can lead to:  A heart attack.  A stroke.  A weakened blood vessel (aneurysm).  Heart failure.  Kidney damage.  Eye damage.  Metabolic syndrome.  Memory and concentration problems. What changes can I make to manage my hypertension? Hypertension can be managed by making lifestyle changes and possibly by taking medicines. Your health care provider will help you make a plan to bring your blood pressure within a normal range. Eating and drinking   Eat a diet that is high in fiber and potassium, and low in salt (sodium), added sugar, and fat. An example eating plan is called the DASH (Dietary Approaches to Stop Hypertension) diet. To eat this way: ? Eat plenty of fresh fruits and vegetables. Try to fill half of your plate at each meal with fruits and vegetables. ? Eat whole grains, such as whole wheat pasta, brown rice, or whole grain bread. Fill about one quarter of your plate with whole grains. ? Eat low-fat diary products. ? Avoid fatty cuts of meat, processed or cured meats, and poultry with skin. Fill about one quarter of your plate with lean proteins such as fish, chicken without skin, beans, eggs, and tofu. ? Avoid premade and processed foods. These tend to be higher in sodium, added sugar, and fat.  Reduce your daily sodium intake. Most people with hypertension should eat less than 1,500 mg of sodium a day.  Limit alcohol intake to no more than 1 drink a day for nonpregnant women and 2 drinks a day for men. One drink equals 12 oz of beer, 5 oz of  wine, or 1 oz of hard liquor. Lifestyle  Work with your health care provider to maintain a healthy body weight, or to lose weight. Ask what an ideal weight is for you.  Get at least 30 minutes of exercise that causes your heart to beat faster (aerobic exercise) most days of the week. Activities may include walking, swimming, or biking.  Include exercise to strengthen your muscles (resistance exercise), such as weight lifting, as part of your weekly exercise routine. Try to do these types of exercises for 30 minutes at least 3 days a week.  Do not use any products that contain nicotine or tobacco, such as cigarettes and e-cigarettes. If you need help quitting, ask your health care provider.  Control any long-term (chronic) conditions you have, such as high cholesterol or diabetes. Monitoring  Monitor your blood pressure at home as told by your health care provider. Your personal target blood pressure may vary depending on your medical conditions, your age, and other factors.  Have your blood pressure checked regularly, as often as told by your health care provider. Working with your health care provider  Review all the medicines you take with your health care provider because there may be side effects or  interactions.  Talk with your health care provider about your diet, exercise habits, and other lifestyle factors that may be contributing to hypertension.  Visit your health care provider regularly. Your health care provider can help you create and adjust your plan for managing hypertension. Will I need medicine to control my blood pressure? Your health care provider may prescribe medicine if lifestyle changes are not enough to get your blood pressure under control, and if:  Your systolic blood pressure is 130 or higher.  Your diastolic blood pressure is 80 or higher. Take medicines only as told by your health care provider. Follow the directions carefully. Blood pressure medicines must  be taken as prescribed. The medicine does not work as well when you skip doses. Skipping doses also puts you at risk for problems. Contact a health care provider if:  You think you are having a reaction to medicines you have taken.  You have repeated (recurrent) headaches.  You feel dizzy.  You have swelling in your ankles.  You have trouble with your vision. Get help right away if:  You develop a severe headache or confusion.  You have unusual weakness or numbness, or you feel faint.  You have severe pain in your chest or abdomen.  You vomit repeatedly.  You have trouble breathing. Summary  Hypertension is when the force of blood pumping through your arteries is too strong. If this condition is not controlled, it may put you at risk for serious complications.  Your personal target blood pressure may vary depending on your medical conditions, your age, and other factors. For most people, a normal blood pressure is less than 120/80.  Hypertension is managed by lifestyle changes, medicines, or both. Lifestyle changes include weight loss, eating a healthy, low-sodium diet, exercising more, and limiting alcohol. This information is not intended to replace advice given to you by your health care provider. Make sure you discuss any questions you have with your health care provider. Document Released: 08/27/2012 Document Revised: 03/27/2019 Document Reviewed: 10/31/2016 Elsevier Patient Education  [place].   We will contact you when lab results are available. Continue all medications as directed. Great job on the weight loss. Remain well hydrated, follow Mediterranean diet, reduce salt use. Remain as active as possible. Please follow-up in 2 weeks, re: elevated blood pressure- both of your readings were above goal. Continue to social distance and wear a mask when in public. GREAT TO SEE YOU!

## 2019-10-12 NOTE — Assessment & Plan Note (Signed)
He is 100% compliance with nightly CPAP

## 2019-10-12 NOTE — Assessment & Plan Note (Signed)
157/91 Laid on L side >10 mins BP recheck  151/83 Discussed diet F/u 2 weeks

## 2019-10-12 NOTE — Assessment & Plan Note (Signed)
He has lost >6 lbs since last OV 4 weeks ago. Current wt 257 Body mass index is 38.88 kg/m.

## 2019-10-12 NOTE — Assessment & Plan Note (Signed)
We will contact you when lab results are available. Continue all medications as directed. Great job on the weight loss. Remain well hydrated, follow Mediterranean diet, reduce salt use. Remain as active as possible. Please follow-up in 2 weeks, re: elevated blood pressure- both of your readings were above goal. Continue to social distance and wear a mask when in public.

## 2019-10-12 NOTE — Progress Notes (Signed)
Subjective:    Patient ID: Garrett Holmes, male    DOB: Aug 24, 1966, 53 y.o.   MRN: [id number]  HPI:09/14/2019 OV: Garrett Holmes is here to establish as a new pt.  He is a pleasant 53 year old male.   PMH: HLD, OSA, Obesity, Constipation He denies being on Metoprolol for HTN-however Rx listed in system-last filled 2013 Body mass index is 39.74 kg/m.  Current wt 263 He reports 100% compliance with nightly CPAP He denies regular exercise- diet high in saturated fat/CHO GAD- previously on Escitalopram- he stopped >2 months ago- felt that caused weight gain and fatigue- would like to try another SSRI He reports taking Alprazolam 0.25mg  1-2 tablets per month [place] Controlled Substance Database reviewed- no refills in 2019 for benzo He reports difficultly falling asleep- discussed Sleep Hygiene and increasing regular exercise He quite tobacco use >10 years ago He drinks 6-12 Bud Lights on Friday and Saturday evenings- none other days of week He is currently on Atorvastatin, denies myalgia's  10/12/2019 OV: Garrett Holmes is here for 4 week f/u: GAD, fasting labs He was started on  Fluoxtine (Prozac) , he has titrated up to 20mg  QD. He states "I feel so much better". He reports stable mood, denies SI/HI. He has not used any PRN Alprazolam 0.25mg  since starting the Alprazolam. He continues to drink 5-6 16 oz bottles of water. He has lost >6 lbs since last OV 4 weeks ago. Current wt 257 Body mass index is 38.88 kg/m. He continues to abstain from tobacco/vape use. He again denies ever being on anti-hypertensive Both BP readings above goal Discussed diet and reducing ETOH use He is 100% compliance with nightly CPAP  Depression screen [facility] 2/9 10/12/2019 09/14/2019 03/20/2018  Decreased Interest 0 0 0  Down, Depressed, Hopeless 0 0 0  PHQ - 2 Score 0 0 0  Altered sleeping 1 3 -  Tired, decreased energy 1 1 -  Change in appetite 0 1 -  Feeling bad or failure about yourself  0 0 -  Trouble  concentrating 0 0 -  Moving slowly or fidgety/restless 0 0 -  Suicidal thoughts 0 0 -  PHQ-9 Score 2 5 -  Difficult doing work/chores Not difficult at all Not difficult at all -   GAD 7 : Generalized Anxiety Score 10/12/2019  Nervous, Anxious, on Edge 0  Control/stop worrying 0  Worry too much - different things 0  Trouble relaxing 0  Restless 0  Easily annoyed or irritable 0  Afraid - awful might happen 0  Total GAD 7 Score 0     Patient Care Team    Relationship Specialty Notifications Start End  , Berna Spare, NP PCP - General Family Medicine  09/14/19     Patient Active Problem List   Diagnosis Date Noted  . Elevated blood pressure reading 10/12/2019  . Healthcare maintenance 09/14/2019  . GAD (generalized anxiety disorder) 09/14/2019  . OSA (obstructive sleep apnea) 05/04/2018  . Snoring 05/04/2018  . Former smoker 03/13/2016  . Constipation 03/13/2016  . Other allergic rhinitis 03/13/2016  . Hyperlipidemia 05/22/2011  . Obesity (BMI 30-39.9) 05/22/2011     Past Medical History:  Diagnosis Date  . Allergy   . Anxiety   . Depression   . Dyslipidemia   . Hyperlipidemia      Past Surgical History:  Procedure Laterality Date  . COLONOSCOPY  2012   [facility]G     Family History  Problem Relation Age of Onset  .  Cancer Mother 69       Renal cancer     Social History   Substance and Sexual Activity  Drug Use Not Currently  . Types: Morphine     Social History   Substance and Sexual Activity  Alcohol Use Yes  . Alcohol/week: 1.0 standard drinks  . Types: 1 Cans of beer per week     Social History   Tobacco Use  Smoking Status Former Smoker  . Types: Cigarettes  . Quit date: 12/17/2010  . Years since quitting: 8.8  Smokeless Tobacco Never Used     Outpatient Encounter Medications as of 10/12/2019  Medication Sig  . ALPRAZolam (XANAX) 0.25 MG tablet Take 1 tablet (0.25 mg total) by mouth 2 (two) times daily as needed for anxiety.  Marland Kitchen  atorvastatin (LIPITOR) 40 MG tablet Take 1 tablet (40 mg total) by mouth daily.  Marland Kitchen FLUoxetine (PROZAC) 10 MG capsule Take 1 capsule (10 mg total) by mouth daily. For 1 week.  Marland Kitchen FLUoxetine (PROZAC) 20 MG capsule Take 1 capsule (20 mg total) by mouth daily. After finishing 10mg  dosage.  . Linaclotide (LINZESS) 145 MCG CAPS capsule Take 145 mcg by mouth daily.  . [DISCONTINUED] metoprolol succinate (TOPROL-XL) 25 MG 24 hr tablet Take 1 tablet (25 mg total) by mouth daily.   No facility-administered encounter medications on file as of 10/12/2019.     Allergies: Sudafed [pseudoephedrine hcl]  Body mass index is 38.88 kg/m.  Blood pressure (!) 151/83, pulse (!) 49, temperature (!) 97.5 F (36.4 C), temperature source Oral, height 5' 8.25" (1.734 m), weight 257 lb 9.6 oz (116.8 kg), SpO2 99 %.  Review of Systems  Constitutional: Positive for fatigue. Negative for activity change, appetite change, chills, diaphoresis, fever and unexpected weight change.  Eyes: Negative for visual disturbance.  Respiratory: Negative for cough, chest tightness, shortness of breath, wheezing and stridor.   Cardiovascular: Negative for chest pain, palpitations and leg swelling.  Endocrine: Negative for polydipsia, polyphagia and polyuria.  Neurological: Negative for dizziness and headaches.  Psychiatric/Behavioral: Negative for agitation, behavioral problems, confusion, decreased concentration, dysphoric mood, hallucinations, self-injury, sleep disturbance and suicidal ideas. The patient is not nervous/anxious and is not hyperactive.        Objective:   Physical Exam Vitals signs and nursing note reviewed.  Constitutional:      General: He is not in acute distress.    Appearance: He is obese. He is not ill-appearing or toxic-appearing.  HENT:     Head: Normocephalic.  Cardiovascular:     Rate and Rhythm: Regular rhythm. Bradycardia present.     Pulses: Normal pulses.     Heart sounds: Normal heart sounds.  No murmur. No friction rub. No gallop.   Pulmonary:     Effort: Pulmonary effort is normal. No respiratory distress.     Breath sounds: Normal breath sounds. No stridor. No wheezing, rhonchi or rales.  Chest:     Chest wall: No tenderness.  Skin:    Capillary Refill: Capillary refill takes less than 2 seconds.  Neurological:     Mental Status: He is alert and oriented to person, place, and time.  Psychiatric:        Mood and Affect: Mood normal.        Behavior: Behavior normal.        Thought Content: Thought content normal.        Judgment: Judgment normal.       Assessment & Plan:   1. Hyperlipidemia, unspecified  hyperlipidemia type   2. Healthcare maintenance   3. GAD (generalized anxiety disorder)   4. OSA (obstructive sleep apnea)   5. Obesity (BMI 30-39.9)   6. Elevated blood pressure reading     GAD (generalized anxiety disorder) He was started on  Fluoxtine (Prozac) , he has titrated up to 20mg  QD. He states "I feel so much better". He reports stable mood, denies SI/HI. He has not used any PRN Alprazolam 0.25mg  since starting the Alprazolam.  OSA (obstructive sleep apnea) He is 100% compliance with nightly CPAP  Obesity (BMI 30-39.9) He has lost >6 lbs since last OV 4 weeks ago. Current wt 257 Body mass index is 38.88 kg/m.  Hyperlipidemia Lipid panel drawn today  Healthcare maintenance We will contact you when lab results are available. Continue all medications as directed. Great job on the weight loss. Remain well hydrated, follow Mediterranean diet, reduce salt use. Remain as active as possible. Please follow-up in 2 weeks, re: elevated blood pressure- both of your readings were above goal. Continue to social distance and wear a mask when in public.  Elevated blood pressure reading 157/91 Laid on L side >10 mins BP recheck  151/83 Discussed diet F/u 2 weeks    FOLLOW-UP:  Return for Blood Pressure.

## 2019-10-12 NOTE — Assessment & Plan Note (Signed)
He was started on  Fluoxtine (Prozac) , he has titrated up to 20mg  QD. He states "I feel so much better". He reports stable mood, denies SI/HI. He has not used any PRN Alprazolam 0.25mg  since starting the Alprazolam.

## 2019-10-12 NOTE — Assessment & Plan Note (Signed)
>>  ASSESSMENT AND PLAN FOR ELEVATED BLOOD PRESSURE READING WRITTEN ON 10/12/2019 10:04 AM BY DANFORD, KATY D, NP  157/91 Laid on L side >10 mins BP recheck  151/83 Discussed diet F/u 2 weeks

## 2019-10-13 LAB — COMPREHENSIVE METABOLIC PANEL
ALT: 43 IU/L (ref 0–44)
AST: 31 IU/L (ref 0–40)
Albumin/Globulin Ratio: 1.8 (ref 1.2–2.2)
Albumin: 4.4 g/dL (ref 3.8–4.9)
Alkaline Phosphatase: 101 IU/L (ref 39–117)
BUN/Creatinine Ratio: 9 (ref 9–20)
BUN: 9 mg/dL (ref 6–24)
Bilirubin Total: 0.6 mg/dL (ref 0.0–1.2)
CO2: 24 mmol/L (ref 20–29)
Calcium: 9.4 mg/dL (ref 8.7–10.2)
Chloride: 101 mmol/L (ref 96–106)
Creatinine, Ser: 1.02 mg/dL (ref 0.76–1.27)
GFR calc Af Amer: 98 mL/min/{1.73_m2} (ref >59)
GFR calc non Af Amer: 85 mL/min/{1.73_m2} (ref >59)
Globulin, Total: 2.4 g/dL (ref 1.5–4.5)
Glucose: 93 mg/dL (ref 65–99)
Potassium: 4.9 mmol/L (ref 3.5–5.2)
Sodium: 139 mmol/L (ref 134–144)
Total Protein: 6.8 g/dL (ref 6.0–8.5)

## 2019-10-13 LAB — CBC WITH DIFFERENTIAL/PLATELET
Basophils Absolute: 0.1 10*3/uL (ref 0.0–0.2)
Basos: 1 %
EOS (ABSOLUTE): 0.3 10*3/uL (ref 0.0–0.4)
Eos: 3 %
Hematocrit: 42 % (ref 37.5–51.0)
Hemoglobin: 14 g/dL (ref 13.0–17.7)
Immature Grans (Abs): 0.1 10*3/uL (ref 0.0–0.1)
Immature Granulocytes: 1 %
Lymphocytes Absolute: 2.1 10*3/uL (ref 0.7–3.1)
Lymphs: 23 %
MCH: 29.9 pg (ref 26.6–33.0)
MCHC: 33.3 g/dL (ref 31.5–35.7)
MCV: 90 fL (ref 79–97)
Monocytes Absolute: 0.9 10*3/uL (ref 0.1–0.9)
Monocytes: 9 %
Neutrophils Absolute: 6 10*3/uL (ref 1.4–7.0)
Neutrophils: 63 %
Platelets: 234 10*3/uL (ref 150–450)
RBC: 4.68 x10E6/uL (ref 4.14–5.80)
RDW: 13.1 % (ref 11.6–15.4)
WBC: 9.4 10*3/uL (ref 3.4–10.8)

## 2019-10-13 LAB — TSH: TSH: 1.69 u[IU]/mL (ref 0.450–4.500)

## 2019-10-13 LAB — LIPID PANEL
Chol/HDL Ratio: 4.5 ratio (ref 0.0–5.0)
Cholesterol, Total: 183 mg/dL (ref 100–199)
HDL: 41 mg/dL (ref >39)
LDL Chol Calc (NIH): 59 mg/dL (ref 0–99)
Triglycerides: 552 mg/dL (ref 0–149)
VLDL Cholesterol Cal: 83 mg/dL — ABNORMAL HIGH (ref 5–40)

## 2019-10-13 LAB — HEMOGLOBIN A1C
Est. average glucose Bld gHb Est-mCnc: 117 mg/dL
Hgb A1c MFr Bld: 5.7 % — ABNORMAL HIGH (ref 4.8–5.6)

## 2019-10-21 ENCOUNTER — Other Ambulatory Visit: Payer: Self-pay | Admitting: Adult Health

## 2019-10-22 ENCOUNTER — Ambulatory Visit (INDEPENDENT_AMBULATORY_CARE_PROVIDER_SITE_OTHER): Payer: Commercial Managed Care - PPO | Admitting: Adult Health

## 2019-10-22 ENCOUNTER — Other Ambulatory Visit: Payer: Self-pay

## 2019-10-22 ENCOUNTER — Encounter: Payer: Self-pay | Admitting: Adult Health

## 2019-10-22 DIAGNOSIS — R0989 Other specified symptoms and signs involving the circulatory and respiratory systems: Secondary | ICD-10-CM | POA: Diagnosis not present

## 2019-10-22 MED ORDER — ALBUTEROL SULFATE HFA 108 (90 BASE) MCG/ACT IN AERS: 1.0000 | INHALATION_SPRAY | RESPIRATORY_TRACT | 1 refills | Status: DC | PRN

## 2019-10-22 MED ORDER — HYDROCOD POLST-CPM POLST ER 10-8 MG/5ML PO SUER: 5.0000 mL | Freq: Two times a day (BID) | ORAL | 0 refills | Status: DC | PRN

## 2019-10-22 NOTE — Assessment & Plan Note (Signed)
Assessment and Plan: Increase fluids/rest Alternate OTC Acetaminophen and Ibuprofen. Albuterol, Tussionex[place] Controlled Substance Database reviewed- no aberrancies noted. Avoid any OTC remedies with decongestant due to elevated BP Proceed to [facility] tomorrow for Corona virus testing,  reviewed CDC quarantine guidelines. Discussed at length red flag sx's and to when to seek immediate medical care- he verbalized understanding/agreement.  Follow Up Instructions: Keep f/u Monday if SARS-CoV-2 if neg and sx's have resolved CXL Monday appt if SARS-CoV-2 if neg and sx's still present. Reach out if sx's worsen   I discussed the assessment and treatment plan with the patient. The patient was provided an opportunity to ask questions and all were answered. The patient agreed with the plan and demonstrated an understanding of the instructions.   The patient was advised to call back or seek an in-person evaluation if the symptoms worsen or if the condition fails to improve as anticipated.

## 2019-10-22 NOTE — Progress Notes (Signed)
Virtual Visit via Telephone Note  I connected with Garrett Holmes on 10/22/19 at  3:45 PM EST by telephone and verified that I am speaking with the correct person using two identifiers.  Location: Patient: Home Provider: In Clinic   I discussed the limitations, risks, security and privacy concerns of performing an evaluation and management service by telephone and the availability of in person appointments. I also discussed with the patient that there may be a patient responsible charge related to this service. The patient expressed understanding and agreed to proceed.   History of Present Illness: Mr. Sasek calls in today with post nasal gtt, clear nasal drainage, sore throat in mornings that will improve throughout the day, productive cough (? Mucus color), fatigue, wheezing- sx's progressively worsening the last 4 days. He denies fever- last oral temp 98.28f oral. He denies night sweats/chills/N/V/D/C. He denies loss of sense smell, however has reduced sense of taste. He has used OTC Claritin, OTC Ibuprofen, Mucinex-D Discussed that with his elevated BP he should avoid any OTC remedies with a decongestant component. He denies any known exposure to [facility] Virus. He denies chest pain/chest pressure with exertion. He denies tobacco/vape use. Advised that he should be tested be Corona Virus and reviewed CDC quarantine guidelines  Patient Care Team    Relationship Specialty Notifications Start End  Rodderick Holtzer, Berna Spare, NP PCP - General Family Medicine  09/14/19     Patient Active Problem List   Diagnosis Date Noted  . Elevated blood pressure reading 10/12/2019  . Healthcare maintenance 09/14/2019  . GAD (generalized anxiety disorder) 09/14/2019  . OSA (obstructive sleep apnea) 05/04/2018  . Snoring 05/04/2018  . Former smoker 03/13/2016  . Constipation 03/13/2016  . Other allergic rhinitis 03/13/2016  . Hyperlipidemia 05/22/2011  . Obesity (BMI 30-39.9) 05/22/2011     Past Medical  History:  Diagnosis Date  . Allergy   . Anxiety   . Depression   . Dyslipidemia   . Hyperlipidemia      Past Surgical History:  Procedure Laterality Date  . COLONOSCOPY  2012   HUNG     Family History  Problem Relation Age of Onset  . Cancer Mother 36       Renal cancer     Social History   Substance and Sexual Activity  Drug Use Not Currently  . Types: Morphine     Social History   Substance and Sexual Activity  Alcohol Use Yes  . Alcohol/week: 1.0 standard drinks  . Types: 1 Cans of beer per week     Social History   Tobacco Use  Smoking Status Former Smoker  . Types: Cigarettes  . Quit date: 12/17/2010  . Years since quitting: 8.8  Smokeless Tobacco Never Used     Outpatient Encounter Medications as of 10/22/2019  Medication Sig  . ALPRAZolam (XANAX) 0.25 MG tablet Take 1 tablet (0.25 mg total) by mouth 2 (two) times daily as needed for anxiety.  Marland Kitchen atorvastatin (LIPITOR) 40 MG tablet Take 1 tablet (40 mg total) by mouth daily.  Marland Kitchen FLUoxetine (PROZAC) 20 MG capsule TAKE 1 CAPSULE BY MOUTH ONCE DAILY. AFTER FINISHING 10 MG DOSAGE  . Linaclotide (LINZESS) 145 MCG CAPS capsule Take 145 mcg by mouth daily.  . [DISCONTINUED] FLUoxetine (PROZAC) 10 MG capsule Take 1 capsule (10 mg total) by mouth daily. For 1 week.  . [DISCONTINUED] metoprolol succinate (TOPROL-XL) 25 MG 24 hr tablet Take 1 tablet (25 mg total) by mouth daily.   No facility-administered  encounter medications on file as of 10/22/2019.     Allergies: Sudafed [pseudoephedrine hcl]  Body mass index is 37.43 kg/m.  Temperature 97.7 F (36.5 C), temperature source Oral, height 5' 8.25" (1.734 m), weight 248 lb (112.5 kg). Review of Systems: General:   Denies fever, chills, unexplained weight loss.  Fatigue + Optho/Auditory:   Denies visual changes, blurred vision/LOV Respiratory: Cough +, Wheezing +, Nasal Drainage +  Cardiovascular:   Denies chest pain, palpitations, new onset  peripheral edema  Gastrointestinal:   Denies nausea, vomiting, diarrhea.  Genitourinary: Denies dysuria, freq/ urgency, flank pain or discharge from genitals.  Endocrine:Denies hot or cold intolerance, polyuria, polydipsia. Musculoskeletal:   Denies unexplained myalgias, joint swelling, unexplained arthralgias, gait problems.  Skin:  Denies rash, suspicious lesions Neurological: Denies dizziness, unexplained weakness, numbness, Denies HA  Psychiatric/Behavioral:   Denies mood changes, suicidal or homicidal ideations, hallucinations  Observations/Objective: Pt audibly wheezing during telephone conversation  Assessment and Plan: Increase fluids/rest Alternate OTC Acetaminophen and Ibuprofen. Albuterol, Tussionex-Center Controlled Substance Database reviewed- no aberrancies noted. Avoid any OTC remedies with decongestant due to elevated BP Proceed to [facility] tomorrow for Corona virus testing,  reviewed CDC quarantine guidelines. Discussed at length red flag sx's and to when to seek immediate medical care- he verbalized understanding/agreement.  Follow Up Instructions: Keep f/u Monday if SARS-CoV-2 if neg and sx's have resolved CXL Monday appt if SARS-CoV-2 if neg and sx's still present. Reach out if sx's worsen   I discussed the assessment and treatment plan with the patient. The patient was provided an opportunity to ask questions and all were answered. The patient agreed with the plan and demonstrated an understanding of the instructions.   The patient was advised to call back or seek an in-person evaluation if the symptoms worsen or if the condition fails to improve as anticipated.  I provided 15 minutes of non-face-to-face time during this encounter.   Esaw Grandchild, NP

## 2019-10-23 ENCOUNTER — Other Ambulatory Visit: Payer: Self-pay

## 2019-10-23 DIAGNOSIS — Z20822 Contact with and (suspected) exposure to covid-19: Secondary | ICD-10-CM

## 2019-10-24 LAB — NOVEL CORONAVIRUS, NAA: SARS-CoV-2, NAA: NOT DETECTED

## 2019-10-26 ENCOUNTER — Other Ambulatory Visit: Payer: Self-pay

## 2019-10-26 ENCOUNTER — Encounter: Payer: Self-pay | Admitting: Adult Health

## 2019-10-26 ENCOUNTER — Ambulatory Visit (INDEPENDENT_AMBULATORY_CARE_PROVIDER_SITE_OTHER): Payer: Commercial Managed Care - PPO | Admitting: Adult Health

## 2019-10-26 VITALS — BP 130/78 | HR 65 | Temp 98.2°F | Ht 68.25 in | Wt 254.5 lb

## 2019-10-26 DIAGNOSIS — R030 Elevated blood-pressure reading, without diagnosis of hypertension: Secondary | ICD-10-CM

## 2019-10-26 DIAGNOSIS — E781 Pure hyperglyceridemia: Secondary | ICD-10-CM

## 2019-10-26 DIAGNOSIS — R0989 Other specified symptoms and signs involving the circulatory and respiratory systems: Secondary | ICD-10-CM | POA: Diagnosis not present

## 2019-10-26 DIAGNOSIS — E785 Hyperlipidemia, unspecified: Secondary | ICD-10-CM | POA: Diagnosis not present

## 2019-10-26 DIAGNOSIS — F411 Generalized anxiety disorder: Secondary | ICD-10-CM | POA: Diagnosis not present

## 2019-10-26 MED ORDER — IPRATROPIUM-ALBUTEROL 0.5-2.5 (3) MG/3ML IN SOLN
3.0000 mL | Freq: Once | RESPIRATORY_TRACT | Status: AC
  Administered 2019-10-26: 3 mL via RESPIRATORY_TRACT

## 2019-10-26 NOTE — Patient Instructions (Addendum)
Managing Your Hypertension Hypertension is commonly called high blood pressure. This is when the force of your blood pressing against the walls of your arteries is too strong. Arteries are blood vessels that carry blood from your heart throughout your body. Hypertension forces the heart to work harder to pump blood, and may cause the arteries to become narrow or stiff. Having untreated or uncontrolled hypertension can cause heart attack, stroke, kidney disease, and other problems. What are blood pressure readings? A blood pressure reading consists of a higher number over a lower number. Ideally, your blood pressure should be below 120/80. The first ("top") number is called the systolic pressure. It is a measure of the pressure in your arteries as your heart beats. The second ("bottom") number is called the diastolic pressure. It is a measure of the pressure in your arteries as the heart relaxes. What does my blood pressure reading mean? Blood pressure is classified into four stages. Based on your blood pressure reading, your health care provider may use the following stages to determine what type of treatment you need, if any. Systolic pressure and diastolic pressure are measured in a unit called mm Hg. Normal  Systolic pressure: below 784.  Diastolic pressure: below 80. Elevated  Systolic pressure: 696-295.  Diastolic pressure: below 80. Hypertension stage 1  Systolic pressure: 284-132.  Diastolic pressure: 44-01. Hypertension stage 2  Systolic pressure: 027 or above.  Diastolic pressure: 90 or above. What health risks are associated with hypertension? Managing your hypertension is an important responsibility. Uncontrolled hypertension can lead to:  A heart attack.  A stroke.  A weakened blood vessel (aneurysm).  Heart failure.  Kidney damage.  Eye damage.  Metabolic syndrome.  Memory and concentration problems. What changes can I make to manage my hypertension?  Hypertension can be managed by making lifestyle changes and possibly by taking medicines. Your health care provider will help you make a plan to bring your blood pressure within a normal range. Eating and drinking   Eat a diet that is high in fiber and potassium, and low in salt (sodium), added sugar, and fat. An example eating plan is called the DASH (Dietary Approaches to Stop Hypertension) diet. To eat this way: ? Eat plenty of fresh fruits and vegetables. Try to fill half of your plate at each meal with fruits and vegetables. ? Eat whole grains, such as whole wheat pasta, brown rice, or whole grain bread. Fill about one quarter of your plate with whole grains. ? Eat low-fat diary products. ? Avoid fatty cuts of meat, processed or cured meats, and poultry with skin. Fill about one quarter of your plate with lean proteins such as fish, chicken without skin, beans, eggs, and tofu. ? Avoid premade and processed foods. These tend to be higher in sodium, added sugar, and fat.  Reduce your daily sodium intake. Most people with hypertension should eat less than 1,500 mg of sodium a day.  Limit alcohol intake to no more than 1 drink a day for nonpregnant women and 2 drinks a day for men. One drink equals 12 oz of beer, 5 oz of wine, or 1 oz of hard liquor. Lifestyle  Work with your health care provider to maintain a healthy body weight, or to lose weight. Ask what an ideal weight is for you.  Get at least 30 minutes of exercise that causes your heart to beat faster (aerobic exercise) most days of the week. Activities may include walking, swimming, or biking.  Include exercise  to strengthen your muscles (resistance exercise), such as weight lifting, as part of your weekly exercise routine. Try to do these types of exercises for 30 minutes at least 3 days a week.  Do not use any products that contain nicotine or tobacco, such as cigarettes and e-cigarettes. If you need help quitting, ask your health  care provider.  Control any long-term (chronic) conditions you have, such as high cholesterol or diabetes. Monitoring  Monitor your blood pressure at home as told by your health care provider. Your personal target blood pressure may vary depending on your medical conditions, your age, and other factors.  Have your blood pressure checked regularly, as often as told by your health care provider. Working with your health care provider  Review all the medicines you take with your health care provider because there may be side effects or interactions.  Talk with your health care provider about your diet, exercise habits, and other lifestyle factors that may be contributing to hypertension.  Visit your health care provider regularly. Your health care provider can help you create and adjust your plan for managing hypertension. Will I need medicine to control my blood pressure? Your health care provider may prescribe medicine if lifestyle changes are not enough to get your blood pressure under control, and if:  Your systolic blood pressure is 130 or higher.  Your diastolic blood pressure is 80 or higher. Take medicines only as told by your health care provider. Follow the directions carefully. Blood pressure medicines must be taken as prescribed. The medicine does not work as well when you skip doses. Skipping doses also puts you at risk for problems. Contact a health care provider if:  You think you are having a reaction to medicines you have taken.  You have repeated (recurrent) headaches.  You feel dizzy.  You have swelling in your ankles.  You have trouble with your vision. Get help right away if:  You develop a severe headache or confusion.  You have unusual weakness or numbness, or you feel faint.  You have severe pain in your chest or abdomen.  You vomit repeatedly.  You have trouble breathing. Summary  Hypertension is when the force of blood pumping through your arteries  is too strong. If this condition is not controlled, it may put you at risk for serious complications.  Your personal target blood pressure may vary depending on your medical conditions, your age, and other factors. For most people, a normal blood pressure is less than 120/80.  Hypertension is managed by lifestyle changes, medicines, or both. Lifestyle changes include weight loss, eating a healthy, low-sodium diet, exercising more, and limiting alcohol. This information is not intended to replace advice given to you by your health care provider. Make sure you discuss any questions you have with your health care provider. Document Released: 08/27/2012 Document Revised: 03/27/2019 Document Reviewed: 10/31/2016 Elsevier Patient Education  [place].  Increase water intake, strive for at least 125 ounces/day.   Follow Heart Healthy diet.  Increase regular exercise.  Recommend at least 30 minutes daily, 5 days per week of walking, biking, swimming, YouTube/Pinterest workout videos. Recommend re-checking your cholesterol panel in 4 weeks- if Triglycerides still >500- will recommend adding on a medication to help lower. But you can lower it with reducing beer intake and daily walking! Please schedule fasting lab appt in 4 weeks, office visit in 3 months. Continue to social distance and wear a mask when in public. GREAT TO SEE YOU!

## 2019-10-26 NOTE — Assessment & Plan Note (Signed)
>>  ASSESSMENT AND PLAN FOR ELEVATED BLOOD PRESSURE READING WRITTEN ON 10/26/2019 11:10 AM BY DANFORD, KATY D, NP  Increase water intake, strive for at least 125 ounces/day.   Follow Heart Healthy diet.  Increase regular exercise.  Recommend at least 30 minutes daily, 5 days per week of walking, biking, swimming, YouTube/Pinterest workout videos. Recommend re-checking your cholesterol panel in 4 weeks- if Triglycerides still >500- will recommend adding on a medication to help lower. But you can lower it with reducing beer intake and daily walking! Please schedule fasting lab appt in 4 weeks, office visit in 3 months. Continue to social distance and wear a mask when in public.

## 2019-10-26 NOTE — Assessment & Plan Note (Signed)
Wheezing noted LLL/RLL posteriorly Breathing tx administered in clinic Wheezing reduced by 75%- pt tolerated well

## 2019-10-26 NOTE — Assessment & Plan Note (Signed)
Increase water intake, strive for at least 125 ounces/day.   Follow Heart Healthy diet.  Increase regular exercise.  Recommend at least 30 minutes daily, 5 days per week of walking, biking, swimming, YouTube/Pinterest workout videos. Recommend re-checking your cholesterol panel in 4 weeks- if Triglycerides still >500- will recommend adding on a medication to help lower. But you can lower it with reducing beer intake and daily walking! Please schedule fasting lab appt in 4 weeks

## 2019-10-26 NOTE — Progress Notes (Signed)
Subjective:    Patient ID: Garrett Holmes, male    DOB: June 26, 1966, 53 y.o.   MRN: [id number]  HPI:10/12/2019 OV: Garrett Holmes is here for 4 week f/u: GAD, fasting labs Garrett Holmes was started on  Fluoxtine (Prozac) , Garrett Holmes has titrated up to 20mg  QD. Garrett Holmes states "I feel so much better". Garrett Holmes reports stable mood, denies SI/HI. Garrett Holmes has not used any PRN Alprazolam 0.25mg  since starting the Alprazolam. Garrett Holmes continues to drink 5-6 16 oz bottles of water. Garrett Holmes has lost >6 lbs since last OV 4 weeks ago. Current wt 257 Body mass index is 38.88 kg/m. Garrett Holmes continues to abstain from tobacco/vape use. Garrett Holmes again denies ever being on anti-hypertensive Both BP readings above goal Discussed diet and reducing ETOH use Garrett Holmes is 100% compliance with nightly CPAP  10/26/2019 OV: Garrett Holmes is her for 3 week f/u:HTN  Garrett Holmes continues drink high amount of lite beer over weekend- 20 beers over sat/sun. Garrett Holmes denies any regular exercise. Garrett Holmes does not check his BP/HR at home- does not have sphygmomanometer at home. Garrett Holmes denies acute cardiac sx's. 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 Garrett Holmes reports feeling much better than last week- had telemed visit for acute URI- SARS-CoV-2 negative. Garrett Holmes continues to have cough with wheezing. Current wt 254 Garrett Holmes has lost 4 lbs since last OV Body mass index is 38.41 kg/m.   Patient Care Team    Relationship Specialty Notifications Start End  Esaw Grandchild, NP PCP - General Family Medicine  09/14/19     Patient Active Problem List   Diagnosis Date Noted  . Upper respiratory symptom 10/22/2019  . Elevated blood pressure reading 10/12/2019  . Healthcare maintenance 09/14/2019  . GAD (generalized anxiety disorder) 09/14/2019  . OSA (obstructive sleep apnea) 05/04/2018  . Snoring 05/04/2018  . Former smoker 03/13/2016  . Constipation 03/13/2016  . Other allergic rhinitis 03/13/2016  .  Hyperlipidemia 05/22/2011  . Obesity (BMI 30-39.9) 05/22/2011     Past Medical History:  Diagnosis Date  . Allergy   . Anxiety   . Depression   . Dyslipidemia   . Hyperlipidemia      Past Surgical History:  Procedure Laterality Date  . COLONOSCOPY  2012   HUNG     Family History  Problem Relation Age of Onset  . Cancer Mother 56       Renal cancer     Social History   Substance and Sexual Activity  Drug Use Not Currently  . Types: Morphine     Social History   Substance and Sexual Activity  Alcohol Use Yes  . Alcohol/week: 1.0 standard drinks  . Types: 1 Cans of beer per week     Social History   Tobacco Use  Smoking Status Former Smoker  . Types: Cigarettes  . Quit date: 12/17/2010  . Years since quitting: 8.8  Smokeless Tobacco Never Used     Outpatient Encounter Medications as of 10/26/2019  Medication Sig  . albuterol (VENTOLIN HFA) 108 (90 Base) MCG/ACT inhaler Inhale 1-2 puffs into the lungs every 4 (four) hours as needed for wheezing or shortness of breath.  . ALPRAZolam (XANAX) 0.25 MG tablet Take 1 tablet (0.25 mg total) by mouth 2 (two) times daily as needed for anxiety.  Marland Kitchen atorvastatin (LIPITOR) 40 MG tablet Take 1 tablet (40 mg total) by mouth daily.  Marland Kitchen  chlorpheniramine-HYDROcodone (TUSSIONEX PENNKINETIC ER) 10-8 MG/5ML SUER Take 5 mLs by mouth every 12 (twelve) hours as needed.  Marland Kitchen FLUoxetine (PROZAC) 20 MG capsule TAKE 1 CAPSULE BY MOUTH ONCE DAILY. AFTER FINISHING 10 MG DOSAGE  . Linaclotide (LINZESS) 145 MCG CAPS capsule Take 145 mcg by mouth daily.  . [DISCONTINUED] metoprolol succinate (TOPROL-XL) 25 MG 24 hr tablet Take 1 tablet (25 mg total) by mouth daily.   No facility-administered encounter medications on file as of 10/26/2019.     Allergies: Sudafed [pseudoephedrine hcl]  Body mass index is 38.41 kg/m.  Blood pressure 130/78, pulse 65, temperature 98.2 F (36.8 C), temperature source Oral, height 5' 8.25" (1.734 m),  weight 254 lb 8 oz (115.4 kg), SpO2 96 %.  Review of Systems  Constitutional: Positive for fatigue. Negative for activity change, appetite change, chills, diaphoresis, fever and unexpected weight change.  HENT: Negative for congestion.   Respiratory: Positive for cough and wheezing. Negative for choking, chest tightness and shortness of breath.   Cardiovascular: Negative for chest pain, palpitations and leg swelling.  Endocrine: Negative for polydipsia, polyphagia and polyuria.  Neurological: Negative for dizziness and headaches.  Hematological: Negative for adenopathy. Does not bruise/bleed easily.  Psychiatric/Behavioral: Negative for agitation, behavioral problems, confusion, decreased concentration, dysphoric mood, hallucinations, self-injury, sleep disturbance and suicidal ideas. The patient is not nervous/anxious and is not hyperactive.        Objective:   Physical Exam Vitals signs and nursing note reviewed.  Constitutional:      General: Garrett Holmes is not in acute distress.    Appearance: Normal appearance. Garrett Holmes is obese. Garrett Holmes is not ill-appearing, toxic-appearing or diaphoretic.  HENT:     Head: Normocephalic and atraumatic.  Eyes:     Extraocular Movements: Extraocular movements intact.     Conjunctiva/sclera: Conjunctivae normal.     Pupils: Pupils are equal, round, and reactive to light.  Cardiovascular:     Rate and Rhythm: Normal rate and regular rhythm.     Pulses: Normal pulses.     Heart sounds: Normal heart sounds. No murmur. No friction rub. No gallop.   Pulmonary:     Effort: Pulmonary effort is normal. No tachypnea, accessory muscle usage or respiratory distress.     Breath sounds: Examination of the right-middle field reveals wheezing. Examination of the left-middle field reveals wheezing. Examination of the right-lower field reveals wheezing. Examination of the left-lower field reveals wheezing. Wheezing present. No decreased breath sounds, rhonchi or rales.     Comments:  Wheezing noted LLL/RLL posteriorly Breathing tx administered in clinic Wheezing reduced by 75%- pt tolerated well Skin:    General: Skin is warm and dry.  Neurological:     Mental Status: Garrett Holmes is alert and oriented to person, place, and time.  Psychiatric:        Mood and Affect: Mood normal.        Behavior: Behavior normal.        Thought Content: Thought content normal.        Judgment: Judgment normal.       Assessment & Plan:   1. Hypertriglyceridemia   2. Elevated blood pressure reading   3. Hyperlipidemia, unspecified hyperlipidemia type   4. GAD (generalized anxiety disorder)   5. Upper respiratory symptom     Elevated blood pressure reading Increase water intake, strive for at least 125 ounces/day.   Follow Heart Healthy diet.  Increase regular exercise.  Recommend at least 30 minutes daily, 5 days per week of walking, biking,  swimming, YouTube/Pinterest workout videos. Recommend re-checking your cholesterol panel in 4 weeks- if Triglycerides still >500- will recommend adding on a medication to help lower. But you can lower it with reducing beer intake and daily walking! Please schedule fasting lab appt in 4 weeks, office visit in 3 months. Continue to social distance and wear a mask when in public.  Hyperlipidemia Increase water intake, strive for at least 125 ounces/day.   Follow Heart Healthy diet.  Increase regular exercise.  Recommend at least 30 minutes daily, 5 days per week of walking, biking, swimming, YouTube/Pinterest workout videos. Recommend re-checking your cholesterol panel in 4 weeks- if Triglycerides still >500- will recommend adding on a medication to help lower. But you can lower it with reducing beer intake and daily walking! Please schedule fasting lab appt in 4 weeks   GAD (generalized anxiety disorder) Mood stable, denies SI/HI  Upper respiratory symptom Wheezing noted LLL/RLL posteriorly Breathing tx administered in clinic Wheezing  reduced by 75%- pt tolerated well     FOLLOW-UP:  Return in about 3 months (around 01/26/2020) for Regular Follow Up, Hypercholestermia, Obesity, Elevated Blood Pressure.

## 2019-10-26 NOTE — Assessment & Plan Note (Signed)
Increase water intake, strive for at least 125 ounces/day.   Follow Heart Healthy diet.  Increase regular exercise.  Recommend at least 30 minutes daily, 5 days per week of walking, biking, swimming, YouTube/Pinterest workout videos. Recommend re-checking your cholesterol panel in 4 weeks- if Triglycerides still >500- will recommend adding on a medication to help lower. But you can lower it with reducing beer intake and daily walking! Please schedule fasting lab appt in 4 weeks, office visit in 3 months. Continue to social distance and wear a mask when in public.

## 2019-10-26 NOTE — Assessment & Plan Note (Signed)
Mood stable, denies SI/HI 

## 2019-10-26 NOTE — Addendum Note (Signed)
Addended by: Fonnie Mu on: 10/26/2019 11:55 AM   Modules accepted: Orders

## 2019-11-23 ENCOUNTER — Other Ambulatory Visit: Payer: Commercial Managed Care - PPO

## 2019-11-23 ENCOUNTER — Other Ambulatory Visit: Payer: Self-pay

## 2019-11-23 DIAGNOSIS — E781 Pure hyperglyceridemia: Secondary | ICD-10-CM

## 2019-11-24 LAB — LIPID PANEL
Chol/HDL Ratio: 4.6 ratio (ref 0.0–5.0)
Cholesterol, Total: 193 mg/dL (ref 100–199)
HDL: 42 mg/dL (ref >39)
LDL Chol Calc (NIH): 92 mg/dL (ref 0–99)
Triglycerides: 353 mg/dL — ABNORMAL HIGH (ref 0–149)
VLDL Cholesterol Cal: 59 mg/dL — ABNORMAL HIGH (ref 5–40)

## 2019-11-27 ENCOUNTER — Other Ambulatory Visit: Payer: Self-pay

## 2019-11-27 DIAGNOSIS — Z20822 Contact with and (suspected) exposure to covid-19: Secondary | ICD-10-CM

## 2019-11-30 LAB — NOVEL CORONAVIRUS, NAA: SARS-CoV-2, NAA: NOT DETECTED

## 2019-12-01 ENCOUNTER — Other Ambulatory Visit: Payer: Self-pay

## 2019-12-01 ENCOUNTER — Ambulatory Visit: Payer: Commercial Managed Care - PPO | Admitting: Adult Health

## 2019-12-13 ENCOUNTER — Other Ambulatory Visit: Payer: Self-pay | Admitting: Adult Health

## 2019-12-22 LAB — HM COLONOSCOPY

## 2020-02-04 ENCOUNTER — Ambulatory Visit: Payer: Commercial Managed Care - PPO | Admitting: Adult Health

## 2020-02-23 ENCOUNTER — Ambulatory Visit (INDEPENDENT_AMBULATORY_CARE_PROVIDER_SITE_OTHER): Payer: Commercial Managed Care - PPO | Admitting: Family Medicine

## 2020-02-23 ENCOUNTER — Encounter: Payer: Self-pay | Admitting: Family Medicine

## 2020-02-23 ENCOUNTER — Other Ambulatory Visit: Payer: Self-pay

## 2020-02-23 VITALS — Ht 68.0 in | Wt 256.0 lb

## 2020-02-23 DIAGNOSIS — E785 Hyperlipidemia, unspecified: Secondary | ICD-10-CM | POA: Diagnosis not present

## 2020-02-23 DIAGNOSIS — E669 Obesity, unspecified: Secondary | ICD-10-CM

## 2020-02-23 DIAGNOSIS — R030 Elevated blood-pressure reading, without diagnosis of hypertension: Secondary | ICD-10-CM | POA: Diagnosis not present

## 2020-02-23 DIAGNOSIS — Z6838 Body mass index (BMI) 38.0-38.9, adult: Secondary | ICD-10-CM

## 2020-02-23 DIAGNOSIS — F1010 Alcohol abuse, uncomplicated: Secondary | ICD-10-CM

## 2020-02-23 DIAGNOSIS — E781 Pure hyperglyceridemia: Secondary | ICD-10-CM

## 2020-02-23 NOTE — Progress Notes (Signed)
Of note, this is my first time meeting patient.  Patient is new to me and was previously being cared for at our office by Mina Marble, NP, who no longer works at primary care [employer].    Telehealth office visit note for Garrett Holmes, D.O- at Primary Care at [facility]   I connected with current patient today and verified that I am speaking with the correct person using two identifiers.   . Location of the patient: Home . Location of the provider: Office Only the patient (+/- their family members at pt's discretion) and myself were participating in the encounter - This visit type was conducted due to national recommendations for restrictions regarding the COVID-19 Pandemic (e.g. social distancing) in an effort to limit this patient's exposure and mitigate transmission in our community.  This format is felt to be most appropriate for this patient at this time.   - No physical exam could be performed with this format, beyond that communicated to [place] by the patient/ family members as noted.   - Additionally my office staff/ schedulers discussed with the patient that there may be a monetary charge related to this service, depending on their medical insurance.   The patient expressed understanding, and agreed to proceed.     _________________________________________________________________________________   History of Present Illness: No chief complaint on file.    I, Toni Amend, am serving as Education administrator for [employer].  - History of Elevated BP Reading Notes he has never been on blood pressure medicine in the past.  He believes that stress may contribute to occasional elevated blood pressures.  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  Notes he is "on the road a lot" and by the time he gets home, he is tired and does not desire to exercise.  - Anxiety, Mood Swings Was changed to Prozac from Lexapro prior.  He was on Lexapro for years.    Says "when I started taking [the Prozac], I dropped a little bit of weight, but then it seems like the next time I refilled it, it was a different manufacturer or different pill, and didn't seem to do anything like the other pill."  Feels his anxiety and mood swings were better at first on the Prozac, and he had increased energy as well, but that this may have changed over time.  Notes he does the same thing every day, and his routines haven't changed, "but it seems like [the Prozac] helped better at first."  Confirms that his anxiety and mood have improved since adjusting his plan from Lexapro to Prozac.  - Heavy Alcohol Use In general now, notes he's drinking about the same amount.  Says he doesn't drink at all during the week,but on wkends-  on Friday and Saturday.  Notes he doesn't drink when he has to go to work.  On a typical Friday, Saturday night, he may have 8-12 beers.   HPI:  Hyperlipidemia:  54 y.o. male here for cholesterol follow-up.   - Patient reports good compliance with treatment plan of:  medication and/ or lifestyle management.    - Patient denies  any acute concerns or problems with management plan   - He denies new onset of Sx  Most recent cholesterol panel was:  Lab Results  Component Value Date   CHOL 193 11/23/2019   HDL 42 11/23/2019   LDLCALC 92 11/23/2019   TRIG 353 (H) 11/23/2019   CHOLHDL 4.6 11/23/2019   Hepatic Function Latest Ref Rng & Units 10/12/2019 03/20/2018 03/18/2017  Total Protein 6.0 - 8.5 g/dL 6.8 6.8 7.2  Albumin 3.8 - 4.9 g/dL 4.4 4.4 4.3  AST 0 - 40 IU/L '31 22 23  '$ ALT 0 - 44 IU/L 43 35 40  Alk Phosphatase 39 - 117 IU/L 101 87 88  Total Bilirubin 0.0 -  1.2 mg/dL 0.6 0.4 0.5  Bilirubin, Direct 0.0 - 0.3 mg/dL - - -    BP Readings from Last 3 Encounters:  10/26/19 130/78  10/12/19 (!) 151/83  09/14/19 132/81   Filed Weights   02/23/20 1103  Weight: 256 lb (116.1 kg)     GAD 7 : Generalized Anxiety Score 02/23/2020 10/12/2019  Nervous, Anxious, on Edge 0 0  Control/stop worrying 0 0  Worry too much - different things 0 0  Trouble relaxing 1 0  Restless 3 0  Easily annoyed or irritable 1 0  Afraid - awful might happen 0 0  Total GAD 7 Score 5 0  Anxiety Difficulty Not difficult at all -    Depression screen [facility] 2/9 02/23/2020 10/26/2019 10/12/2019 09/14/2019 03/20/2018  Decreased Interest 0 0 0 0 0  Down, Depressed, Hopeless 0 0 0 0 0  PHQ - 2 Score 0 0 0 0 0  Altered sleeping 2 0 1 3 -  Tired, decreased energy 0 0 1 1 -  Change in appetite 1 0 0 1 -  Feeling bad or failure about yourself  0 0 0 0 -  Trouble concentrating 0 0 0 0 -  Moving slowly or fidgety/restless 0 0 0 0 -  Suicidal thoughts 0 0 0 0 -  PHQ-9 Score 3 0 2 5 -  Difficult doing work/chores Not difficult at all - Not difficult at all Not difficult at all -      Impression and Recommendations:    1. h/o Elevated blood pressure reading- not HTN   2. Hyperlipidemia- on statin   3. Hypertriglyceridemia >er 500   4. Excessive drinking alcohol on Fri & Sat ( 20+ beers weekend nights)    5. Obesity (BMI 30-39.9)      - Last OV with provder Mina Marble on 10/26/2019, which I reviewed.   Mood Management - Per patient, switched to Prozac from management on Lexapro (for years) in the past.  - Patient will continue current treatment regimen and work on incorporating prudent lifestyle changes.  - To help improve mood, energy, and increase feelings of happiness, encouraged patient to continue to cut back on excessive alcohol use, and work on increasing his physical activity.  - In addition to prescription intervention, reviewed the "spokes of the wheel" of mood  and health management.  Stressed the importance of ongoing prudent habits, including regular exercise, appropriate sleep hygiene, healthful dietary habits, and prayer/meditation to relax.  - Will continue to monitor.  Excessive Drinking Alcohol on Fri & Sat - (20+ beers weekend nights) - Reviewed patient's heavy alcohol use during appointment today.  - Reviewed health detriment and risks of excessive alcohol use, including worsening mood and worsening blood pressure.  Advised patient to reduce his use of alcohol.  -  Education provided to patient today and all questions answered.  - Will continue to monitor.  History of Elevated BP Reading - Not HTN - BP is currently at goal, controlled.  - Per patient, does not regularly check his BP, but per pt, whenever he does check his BP or engages in yearly health assessments at work, his BP is high at first, and then improves when they check it again before he leaves.  - Counseled patient on prevention of hypertension and discussed various treatment options, which always includes dietary and lifestyle modification as first line.   - Advised patient of importance of maintaining cardiovascular fitness.  - Lifestyle changes such as heart healthy diets and engaging in a regular cardiovascular exercise program discussed extensively with patient.   - Regular ambulatory blood pressure monitoring encouraged. - Prior to checking BP, advised patient to wait for 15-20 minutes. - Encouraged patient to keep a log of BP and pulses when he checks them, and bring in to next OV.  - We will continue to monitor.  Hypertriglyceridemia, Hyperlipidemia - on statin - Last FLP obtained 3 months ago Triglycerides = 353, elevated, down from 552 prior. HDL = 42. LDL = 92, up from 59 prior.  - Patient continues Lipitor nightly and is tolerating well. - Continue current treatment regimen.  See med list.  - Dietary changes such as low saturated & trans fat diets for  hyperlipidemia and low carb diets for hypertriglyceridemia discussed with patient.    - Encouraged patient to cut back on beer to help reduce his triglycerides.  - Encouraged patient to follow AHA guidelines for regular exercise and also engage in weight loss if BMI above 25.   - Will continue to monitor and re-check as discussed.  BMI Counseling - Body mass index is 38.92 kg/m Explained to patient what BMI refers to, and what it means medically.    Told patient to think about it as a "medical risk stratification measurement" and how increasing BMI is associated with increasing risk/ or worsening state of various diseases such as hypertension, hyperlipidemia, diabetes, premature OA, depression etc.  American Heart Association guidelines for healthy diet, basically Mediterranean diet, and exercise guidelines of 30 minutes 5 days per week or more discussed in detail.  Lifestyle & Preventative Health Maintenance - Advised patient to continue working toward exercising and prudent weight loss to improve overall mental, physical, and emotional health.    - Encouraged patient to engage in daily physical activity as tolerated, especially a formal exercise routine.  Recommended that the patient eventually strive for at least 150 minutes of moderate cardiovascular activity per week according to guidelines established by the [place].   - Healthy dietary habits encouraged, including low-carb, and high amounts of lean protein in diet.   - Patient should also consume adequate amounts of water.  - Health counseling performed.  All questions answered.    - Additionally, discussion had with patient regarding our treatment plan, and their biases/concerns about that plan were used in my medical decision making today.    - The patient agreed with the plan and demonstrated an understanding of the instructions.   No barriers to understanding were identified.     Return for CPE in 4 months with full fasting lab  work same day.    Time spent on visit including pre-visit chart review and post-visit care was 15 minutes.  Note:  This note was prepared with assistance of Dragon voice recognition software. Occasional wrong-word or sound-a-like  substitutions may have occurred due to the inherent limitations of voice recognition software.   The [place] was signed into law in 2016 which includes the topic of electronic health records.  This provides immediate access to information in MyChart.  This includes consultation notes, operative notes, office notes, lab results and pathology reports.  If you have any questions about what you read please let us know at your next visit or call us at the office.  We are right here with you.  This document serves as a record of services personally performed by Garrett Dance, DO. It was created on her behalf by Toni Amend, a trained medical scribe. The creation of this record is based on the scribe's personal observations and the provider's statements to them.   This case required medical decision making of at least moderate complexity. The above documentation from Toni Amend, medical scribe, has been reviewed by Marjory Sneddon, D.O.     __________________________________________________________________________________     Patient Care Team    Relationship Specialty Notifications Start End  Esaw Grandchild, NP PCP - General Family Medicine  09/14/19      -Vitals obtained; medications/ allergies reconciled;  personal medical, social, Sx etc.histories were updated by CMA, reviewed by me and are reflected in chart   Patient Active Problem List   Diagnosis Date Noted  . Hypertriglyceridemia >er 500 02/23/2020  . Excessive drinking alcohol on Fri & Sat 02/23/2020  . Upper respiratory symptom 10/22/2019  . Elevated blood pressure reading 10/12/2019  . Healthcare maintenance 09/14/2019  . GAD (generalized anxiety disorder)  09/14/2019  . OSA (obstructive sleep apnea) 05/04/2018  . Snoring 05/04/2018  . Former smoker 03/13/2016  . Constipation 03/13/2016  . Other allergic rhinitis 03/13/2016  . Hyperlipidemia 05/22/2011  . Obesity (BMI 30-39.9) 05/22/2011     Current Meds  Medication Sig  . albuterol (VENTOLIN HFA) 108 (90 Base) MCG/ACT inhaler Inhale 1-2 puffs into the lungs every 4 (four) hours as needed for wheezing or shortness of breath.  . ALPRAZolam (XANAX) 0.25 MG tablet Take 1 tablet (0.25 mg total) by mouth 2 (two) times daily as needed for anxiety.  Marland Kitchen atorvastatin (LIPITOR) 40 MG tablet Take 1 tablet (40 mg total) by mouth daily.  Marland Kitchen FLUoxetine (PROZAC) 20 MG capsule Take 1 capsule (20 mg total) by mouth daily.  . Linaclotide (LINZESS) 145 MCG CAPS capsule Take 145 mcg by mouth daily. PRN per patient     Allergies:  Allergies  Allergen Reactions  . Sudafed [Pseudoephedrine Hcl]     Wires him up, agitation     ROS:  See above HPI for pertinent positives and negatives   Objective:   Height '5\' 8"'$  (1.727 m), weight 256 lb (116.1 kg).  (if some vitals are omitted, this means that patient was UNABLE to obtain them even though they were asked to get them prior to OV today.  They were asked to call us at their earliest convenience with these once obtained. )  General: A & O * 3; sounds in no acute distress; in usual state of health.  Skin: Pt confirms warm and dry extremities and pink fingertips HEENT: Pt confirms lips non-cyanotic Chest: Patient confirms normal chest excursion and movement Respiratory: speaking in full sentences, no conversational dyspnea; patient confirms no use of accessory muscles Psych: insight appears good, mood- appears full

## 2020-03-22 ENCOUNTER — Telehealth: Payer: Self-pay | Admitting: Adult Health

## 2020-03-22 NOTE — Telephone Encounter (Signed)
Patient called state Ins Co denied DOS 02/23/20 chrgs for a TELEHEALTH --- pt request coding review & resubmission to Ins Co..

## 2020-04-11 ENCOUNTER — Other Ambulatory Visit: Payer: Self-pay | Admitting: Family Medicine

## 2020-04-11 DIAGNOSIS — E785 Hyperlipidemia, unspecified: Secondary | ICD-10-CM

## 2020-04-12 ENCOUNTER — Telehealth: Payer: Self-pay | Admitting: Physician Assistant

## 2020-04-12 DIAGNOSIS — E785 Hyperlipidemia, unspecified: Secondary | ICD-10-CM

## 2020-04-12 MED ORDER — ATORVASTATIN CALCIUM 40 MG PO TABS: 40.0000 mg | ORAL_TABLET | Freq: Every day | ORAL | 0 refills | Status: DC

## 2020-04-12 NOTE — Addendum Note (Signed)
Addended by: Fonnie Mu on: 04/12/2020 09:03 AM   Modules accepted: Orders

## 2020-04-12 NOTE — Telephone Encounter (Signed)
Patient is requesting a refill of Lipitor. If approved, please send to [facility] on [address].

## 2020-06-06 ENCOUNTER — Other Ambulatory Visit: Payer: Self-pay | Admitting: Adult Health

## 2020-06-09 ENCOUNTER — Other Ambulatory Visit: Payer: Self-pay | Admitting: Adult Health

## 2020-06-10 ENCOUNTER — Other Ambulatory Visit: Payer: Self-pay | Admitting: Physician Assistant

## 2020-06-10 MED ORDER — FLUOXETINE HCL 20 MG PO CAPS: 20.0000 mg | ORAL_CAPSULE | Freq: Every day | ORAL | 0 refills | Status: DC

## 2020-06-10 NOTE — Telephone Encounter (Signed)
Patient called says pharmacy got a rejection fr refill (advised they probably sent refill to Dr.Opalski who is technically not his PCP anymore & can't auth refill )--Pt understands.  --- Forwarding request to med asst for :    FLUoxetine (PROZAC) 20 MG capsule [[id number]]   Order Details Dose: 20 mg Route: Oral Frequency: Daily  Dispense Quantity: 90 capsule Refills: 0       Sig: Take 1 capsule (20 mg total) by mouth daily.       --- If approved send refill order to  :   [facility] [facility], [address] Phone:  [phone]  Fax:  [phone]     --glh

## 2020-06-29 ENCOUNTER — Other Ambulatory Visit: Payer: Self-pay

## 2020-06-29 ENCOUNTER — Ambulatory Visit (INDEPENDENT_AMBULATORY_CARE_PROVIDER_SITE_OTHER): Payer: Commercial Managed Care - PPO | Admitting: Physician Assistant

## 2020-06-29 ENCOUNTER — Encounter: Payer: Self-pay | Admitting: Physician Assistant

## 2020-06-29 VITALS — BP 137/82 | HR 52 | Temp 98.2°F | Ht 68.25 in | Wt 244.6 lb

## 2020-06-29 DIAGNOSIS — F411 Generalized anxiety disorder: Secondary | ICD-10-CM

## 2020-06-29 DIAGNOSIS — Z0000 Encounter for general adult medical examination without abnormal findings: Secondary | ICD-10-CM

## 2020-06-29 DIAGNOSIS — E785 Hyperlipidemia, unspecified: Secondary | ICD-10-CM

## 2020-06-29 DIAGNOSIS — L989 Disorder of the skin and subcutaneous tissue, unspecified: Secondary | ICD-10-CM

## 2020-06-29 DIAGNOSIS — H6591 Unspecified nonsuppurative otitis media, right ear: Secondary | ICD-10-CM

## 2020-06-29 DIAGNOSIS — R7303 Prediabetes: Secondary | ICD-10-CM | POA: Diagnosis not present

## 2020-06-29 DIAGNOSIS — Z1329 Encounter for screening for other suspected endocrine disorder: Secondary | ICD-10-CM

## 2020-06-29 DIAGNOSIS — Z1283 Encounter for screening for malignant neoplasm of skin: Secondary | ICD-10-CM

## 2020-06-29 DIAGNOSIS — E781 Pure hyperglyceridemia: Secondary | ICD-10-CM

## 2020-06-29 DIAGNOSIS — Z125 Encounter for screening for malignant neoplasm of prostate: Secondary | ICD-10-CM

## 2020-06-29 NOTE — Patient Instructions (Signed)
Preventive Care 41-52 Years Old, Male Preventive care refers to lifestyle choices and visits with your health care provider that can promote health and wellness. This includes:  A yearly physical exam. This is also called an annual well check.  Regular dental and eye exams.  Immunizations.  Screening for certain conditions.  Healthy lifestyle choices, such as eating a healthy diet, getting regular exercise, not using drugs or products that contain nicotine and tobacco, and limiting alcohol use. What can I expect for my preventive care visit? Physical exam Your health care provider will check:  Height and weight. These may be used to calculate body mass index (BMI), which is a measurement that tells if you are at a healthy weight.  Heart rate and blood pressure.  Your skin for abnormal spots. Counseling Your health care provider may ask you questions about:  Alcohol, tobacco, and drug use.  Emotional well-being.  Home and relationship well-being.  Sexual activity.  Eating habits.  Work and work Statistician. What immunizations do I need?  Influenza (flu) vaccine  This is recommended every year. Tetanus, diphtheria, and pertussis (Tdap) vaccine  You may need a Td booster every 10 years. Varicella (chickenpox) vaccine  You may need this vaccine if you have not already been vaccinated. Zoster (shingles) vaccine  You may need this after age 64. Measles, mumps, and rubella (MMR) vaccine  You may need at least one dose of MMR if you were born in 1957 or later. You may also need a second dose. Pneumococcal conjugate (PCV13) vaccine  You may need this if you have certain conditions and were not previously vaccinated. Pneumococcal polysaccharide (PPSV23) vaccine  You may need one or two doses if you smoke cigarettes or if you have certain conditions. Meningococcal conjugate (MenACWY) vaccine  You may need this if you have certain conditions. Hepatitis A  vaccine  You may need this if you have certain conditions or if you travel or work in places where you may be exposed to hepatitis A. Hepatitis B vaccine  You may need this if you have certain conditions or if you travel or work in places where you may be exposed to hepatitis B. Haemophilus influenzae type b (Hib) vaccine  You may need this if you have certain risk factors. Human papillomavirus (HPV) vaccine  If recommended by your health care provider, you may need three doses over 6 months. You may receive vaccines as individual doses or as more than one vaccine together in one shot (combination vaccines). Talk with your health care provider about the risks and benefits of combination vaccines. What tests do I need? Blood tests  Lipid and cholesterol levels. These may be checked every 5 years, or more frequently if you are over 60 years old.  Hepatitis C test.  Hepatitis B test. Screening  Lung cancer screening. You may have this screening every year starting at age 43 if you have a 30-pack-year history of smoking and currently smoke or have quit within the past 15 years.  Prostate cancer screening. Recommendations will vary depending on your family history and other risks.  Colorectal cancer screening. All adults should have this screening starting at age 72 and continuing until age 2. Your health care provider may recommend screening at age 14 if you are at increased risk. You will have tests every 1-10 years, depending on your results and the type of screening test.  Diabetes screening. This is done by checking your blood sugar (glucose) after you have not eaten  for a while (fasting). You may have this done every 1-3 years.  Sexually transmitted disease (STD) testing. Follow these instructions at home: Eating and drinking  Eat a diet that includes fresh fruits and vegetables, whole grains, lean protein, and low-fat dairy products.  Take vitamin and mineral supplements as  recommended by your health care provider.  Do not drink alcohol if your health care provider tells you not to drink.  If you drink alcohol: ? Limit how much you have to 0-2 drinks a day. ? Be aware of how much alcohol is in your drink. In the [place] one drink equals one 12 oz bottle of beer (355 mL), one 5 oz glass of wine (148 mL), or one 1 oz glass of hard liquor (44 mL). Lifestyle  Take daily care of your teeth and gums.  Stay active. Exercise for at least 30 minutes on 5 or more days each week.  Do not use any products that contain nicotine or tobacco, such as cigarettes, e-cigarettes, and chewing tobacco. If you need help quitting, ask your health care provider.  If you are sexually active, practice safe sex. Use a condom or other form of protection to prevent STIs (sexually transmitted infections).  Talk with your health care provider about taking a low-dose aspirin every day starting at age 53. What's next?  Go to your health care provider once a year for a well check visit.  Ask your health care provider how often you should have your eyes and teeth checked.  Stay up to date on all vaccines. This information is not intended to replace advice given to you by your health care provider. Make sure you discuss any questions you have with your health care provider. Document Revised: 11/27/2018 Document Reviewed: 11/27/2018 Elsevier Patient Education  2020 [employer].

## 2020-06-29 NOTE — Progress Notes (Signed)
Male physical   Impression and Recommendations:    No diagnosis found.   1) Anticipatory Guidance: Discussed skin CA prevention and sunscreen when outside along with skin surveillance; eating a balanced and modest diet; physical activity at least 25 minutes per day or minimum of 150 min/ week moderate to intense activity.  2) Immunizations / Screenings / Labs:   All immunizations are up-to-date per recommendations or will be updated today if pt allows.    - Patient understands with dental and vision screens they will schedule independently.  - Will obtain CBC, CMP, HgA1c, Lipid panel, TSH and vit D when fasting, and after shared making will check PSA -UTD on colonoscopy, Tdap- requesting record. -Pt declined Hep C and HIV screenings.  3) Weight:  BMI meaning discussed with patient.  Discussed goal to improve diet habits to improve overall feelings of well being and objective health data. Improve nutrient density of diet through increasing intake of fruits and vegetables and decreasing saturated fats, white flour products and refined sugars.   4) Healthcare Maintenance: -Continue current medication regimen. -Follow heart healthy diet and stay as active as possible. -Placed dermatology referral for skin cancer screening. -Pt reports previous PCP (Dr. Redmond School) prescribed his CPAP supplies and needs new supplies, which he prefers to get from a local medical supply store. He plans to inquire from a local medical supply store.  -Follow up in 3 months for regular OV: Mood, HLD, sleeping issues   No orders of the defined types were placed in this encounter.   No orders of the defined types were placed in this encounter.    No follow-ups on file.   Reminded pt important of f-up preventative CPE in 1 year.  Reminded pt again, this is in addition to any chronic care visits.    Gross side effects, risk and benefits, and alternatives of medications discussed with patient.  Patient  is aware that all medications have potential side effects and we are unable to predict every side effect or drug-drug interaction that may occur.  Expresses verbal understanding and consents to current therapy plan and treatment regimen.  Please see AVS handed out to patient at the end of our visit for further patient instructions/ counseling done pertaining to today's office visit.    Subjective:       CC: CPE   HPI: Garrett Holmes is a 54 y.o. male who presents to [facility] today for a yearly health maintenance exam.     Health Maintenance Summary  - Reviewed and updated, unless pt declines services.  Last Cologuard or Colonoscopy:   12/22/2019, repeat in 7 years (polyps removed) Family history of Colon CA: No Tobacco History Reviewed: Y, former smoker Abdominal Ultrasound:   N/A CT scan for screening lung CA: N/A  Alcohol / drug use:  Reports he usually drinks during the weekends and it varies how much he drinks. States sometimes it'll be less than 14 drinks or sometimes more. Dental Home: Y  Eye exams: Y Dermatology home: N, placed derm referral  Male history: STD concerns: none Additional penile/ urinary concerns: none   Additional concerns beyond Health Maintenance issues:    Needs new CPAP supplies and has been having issues with sleeping, where he wakes up during the night and cannot go back to sleep. States unsure if it is because of his CPAP or anxiety.    Immunization History  Administered Date(s) Administered  . Tdap 10/25/2009  Health Maintenance  Topic Date Due  . COVID-19 Vaccine (1) 07/15/2020 (Originally 07/29/1980)  . TETANUS/TDAP  06/29/2021 (Originally 10/26/2019)  . Hepatitis C Screening  06/29/2021 (Originally [birth year])  . HIV Screening  06/29/2021 (Originally 07/30/1983)  . INFLUENZA VACCINE  07/17/2020  . COLONOSCOPY  12/21/2029       Wt Readings from Last 3 Encounters:  06/29/20 244 lb 9.6 oz (110.9 kg)   02/23/20 256 lb (116.1 kg)  10/26/19 254 lb 8 oz (115.4 kg)   BP Readings from Last 3 Encounters:  06/29/20 137/82  10/26/19 130/78  10/12/19 (!) 151/83   Pulse Readings from Last 3 Encounters:  06/29/20 (!) 52  10/26/19 65  10/12/19 (!) 49    Patient Active Problem List   Diagnosis Date Noted  . Hypertriglyceridemia >er 500 02/23/2020  . Excessive drinking alcohol on Fri & Sat 02/23/2020  . Upper respiratory symptom 10/22/2019  . Elevated blood pressure reading 10/12/2019  . Healthcare maintenance 09/14/2019  . GAD (generalized anxiety disorder) 09/14/2019  . OSA (obstructive sleep apnea) 05/04/2018  . Snoring 05/04/2018  . Former smoker 03/13/2016  . Constipation 03/13/2016  . Other allergic rhinitis 03/13/2016  . Hyperlipidemia 05/22/2011  . Obesity (BMI 30-39.9) 05/22/2011    Past Medical History:  Diagnosis Date  . Allergy   . Anxiety   . Depression   . Dyslipidemia   . Hyperlipidemia     Past Surgical History:  Procedure Laterality Date  . COLONOSCOPY  2012   HUNG    Family History  Problem Relation Age of Onset  . Cancer Mother 41       Renal cancer    Social History   Substance and Sexual Activity  Drug Use Not Currently  . Types: Morphine  ,  Social History   Substance and Sexual Activity  Alcohol Use Yes  . Alcohol/week: 1.0 standard drink  . Types: 1 Cans of beer per week  ,  Social History   Tobacco Use  Smoking Status Former Smoker  . Types: Cigarettes  . Quit date: 12/17/2010  . Years since quitting: 9.5  Smokeless Tobacco Never Used  ,  Social History   Substance and Sexual Activity  Sexual Activity Yes    Patient's Medications  New Prescriptions   No medications on file  Previous Medications   ALPRAZOLAM (XANAX) 0.25 MG TABLET    Take 1 tablet (0.25 mg total) by mouth 2 (two) times daily as needed for anxiety.   ATORVASTATIN (LIPITOR) 40 MG TABLET    Take 1 tablet (40 mg total) by mouth daily.   FLUOXETINE (PROZAC)  20 MG CAPSULE    Take 1 capsule (20 mg total) by mouth daily.   LINACLOTIDE (LINZESS) 145 MCG CAPS CAPSULE    Take 145 mcg by mouth daily. PRN per patient  Modified Medications   No medications on file  Discontinued Medications   ALBUTEROL (VENTOLIN HFA) 108 (90 BASE) MCG/ACT INHALER    Inhale 1-2 puffs into the lungs every 4 (four) hours as needed for wheezing or shortness of breath.   CHLORPHENIRAMINE-HYDROCODONE (TUSSIONEX PENNKINETIC ER) 10-8 MG/5ML SUER    Take 5 mLs by mouth every 12 (twelve) hours as needed.    Sudafed [pseudoephedrine hcl]  Review of Systems: General:   Denies fever, chills, unexplained weight loss.  Optho/Auditory:   Denies visual changes, blurred vision/LOV Respiratory:   Denies SOB, DOE more than baseline levels.   Cardiovascular:   Denies chest pain, palpitations, new onset peripheral edema  Gastrointestinal:   Denies nausea, vomiting, diarrhea.  Genitourinary: Denies dysuria, freq/ urgency, flank pain or discharge from genitals.  Endocrine:     Denies hot or cold intolerance, polyuria, polydipsia. Musculoskeletal:   Denies unexplained myalgias, joint swelling, unexplained arthralgias, gait problems.  Skin:  Denies rash, suspicious lesions Neurological:     Denies dizziness, unexplained weakness, numbness  Psychiatric/Behavioral:   Denies mood changes, suicidal or homicidal ideations, hallucinations    Objective:     Blood pressure 137/82, pulse (!) 52, temperature 98.2 F (36.8 C), temperature source Oral, height 5' 8.25" (1.734 m), weight 244 lb 9.6 oz (110.9 kg), SpO2 97 %. Body mass index is 36.92 kg/m. General Appearance:    Alert, cooperative, no distress, appears stated age  Head:    Normocephalic, without obvious abnormality, atraumatic  Eyes:    PERRL, conjunctiva/corneas clear, EOM's intact, fundi    benign, both eyes  Ears:    Normal TM's and external ear canals, left ear; Middle ear effusion of R ear  Nose:   Nares normal, septum midline,  mucosa normal, no drainage    or sinus tenderness  Throat:   Lips w/o lesion, mucosa moist, and tongue normal; teeth and   gums normal  Neck:   Supple, symmetrical, trachea midline, no adenopathy;    thyroid:  no enlargement/tenderness/nodules; no carotid   bruit or JVD  Back:     Symmetric, no curvature, ROM normal, no CVA tenderness  Lungs:     Clear to auscultation bilaterally, respirations unlabored, no       Wh/ R/ R  Chest Wall:    No tenderness or gross deformity; normal excursion   Heart:    Regular rate and rhythm, S1 and S2 normal, no murmur, rub   or gallop  Abdomen:     Soft, non-tender, bowel sounds active all four quadrants, No G/R/R, no masses, no organomegaly  Genitalia:   Deferred.  Rectal:   Deferred.  Extremities:   Extremities normal, atraumatic, no cyanosis or gross edema  Pulses:  Normal  Skin:   Warm, dry, Skin color, texture, turgor normal, no obvious rashes, suspicious scalp lesion  M-Sk:   Ambulates * 4 w/o difficulty, no gross deformities, tone WNL  Neurologic:   CNII-XII grossly intact, normal strength, sensation and reflexes    Throughout Psych:  No HI/SI, judgement and insight good, Euthymic mood. Full Affect.

## 2020-06-30 LAB — CBC WITH DIFFERENTIAL/PLATELET
Basophils Absolute: 0.1 10*3/uL (ref 0.0–0.2)
Basos: 1 %
EOS (ABSOLUTE): 0.4 10*3/uL (ref 0.0–0.4)
Eos: 4 %
Hematocrit: 45.1 % (ref 37.5–51.0)
Hemoglobin: 15.1 g/dL (ref 13.0–17.7)
Immature Grans (Abs): 0.1 10*3/uL (ref 0.0–0.1)
Immature Granulocytes: 1 %
Lymphocytes Absolute: 2.4 10*3/uL (ref 0.7–3.1)
Lymphs: 22 %
MCH: 30.1 pg (ref 26.6–33.0)
MCHC: 33.5 g/dL (ref 31.5–35.7)
MCV: 90 fL (ref 79–97)
Monocytes Absolute: 1.1 10*3/uL — ABNORMAL HIGH (ref 0.1–0.9)
Monocytes: 10 %
Neutrophils Absolute: 6.7 10*3/uL (ref 1.4–7.0)
Neutrophils: 62 %
Platelets: 186 10*3/uL (ref 150–450)
RBC: 5.02 x10E6/uL (ref 4.14–5.80)
RDW: 13.6 % (ref 11.6–15.4)
WBC: 10.6 10*3/uL (ref 3.4–10.8)

## 2020-06-30 LAB — COMPREHENSIVE METABOLIC PANEL
ALT: 33 IU/L (ref 0–44)
AST: 23 IU/L (ref 0–40)
Albumin/Globulin Ratio: 1.8 (ref 1.2–2.2)
Albumin: 4.4 g/dL (ref 3.8–4.9)
Alkaline Phosphatase: 84 IU/L (ref 48–121)
BUN/Creatinine Ratio: 14 (ref 9–20)
BUN: 13 mg/dL (ref 6–24)
Bilirubin Total: 0.6 mg/dL (ref 0.0–1.2)
CO2: 23 mmol/L (ref 20–29)
Calcium: 9.3 mg/dL (ref 8.7–10.2)
Chloride: 101 mmol/L (ref 96–106)
Creatinine, Ser: 0.92 mg/dL (ref 0.76–1.27)
GFR calc Af Amer: 111 mL/min/{1.73_m2} (ref >59)
GFR calc non Af Amer: 96 mL/min/{1.73_m2} (ref >59)
Globulin, Total: 2.4 g/dL (ref 1.5–4.5)
Glucose: 92 mg/dL (ref 65–99)
Potassium: 4.3 mmol/L (ref 3.5–5.2)
Sodium: 139 mmol/L (ref 134–144)
Total Protein: 6.8 g/dL (ref 6.0–8.5)

## 2020-06-30 LAB — LIPID PANEL
Chol/HDL Ratio: 4 ratio (ref 0.0–5.0)
Cholesterol, Total: 184 mg/dL (ref 100–199)
HDL: 46 mg/dL (ref >39)
LDL Chol Calc (NIH): 99 mg/dL (ref 0–99)
Triglycerides: 230 mg/dL — ABNORMAL HIGH (ref 0–149)
VLDL Cholesterol Cal: 39 mg/dL (ref 5–40)

## 2020-06-30 LAB — HEMOGLOBIN A1C
Est. average glucose Bld gHb Est-mCnc: 114 mg/dL
Hgb A1c MFr Bld: 5.6 % (ref 4.8–5.6)

## 2020-06-30 LAB — PSA: Prostate Specific Ag, Serum: 1.5 ng/mL (ref 0.0–4.0)

## 2020-06-30 LAB — TSH: TSH: 1.37 u[IU]/mL (ref 0.450–4.500)

## 2020-07-15 ENCOUNTER — Telehealth: Payer: Self-pay

## 2020-07-15 NOTE — Telephone Encounter (Signed)
Called Dr. Lanice Shirts office and confirmed that pt has not had a Tdap within the last 10 years.  Charyl Bigger, CMA

## 2020-09-22 ENCOUNTER — Other Ambulatory Visit: Payer: Self-pay | Admitting: Family Medicine

## 2020-09-22 DIAGNOSIS — E785 Hyperlipidemia, unspecified: Secondary | ICD-10-CM

## 2020-09-29 ENCOUNTER — Other Ambulatory Visit: Payer: Self-pay

## 2020-09-29 ENCOUNTER — Ambulatory Visit (INDEPENDENT_AMBULATORY_CARE_PROVIDER_SITE_OTHER): Payer: Commercial Managed Care - PPO | Admitting: Physician Assistant

## 2020-09-29 ENCOUNTER — Encounter: Payer: Self-pay | Admitting: Physician Assistant

## 2020-09-29 VITALS — BP 137/79 | HR 81 | Ht 68.0 in | Wt 251.1 lb

## 2020-09-29 DIAGNOSIS — E781 Pure hyperglyceridemia: Secondary | ICD-10-CM | POA: Diagnosis not present

## 2020-09-29 DIAGNOSIS — Z2821 Immunization not carried out because of patient refusal: Secondary | ICD-10-CM

## 2020-09-29 DIAGNOSIS — F411 Generalized anxiety disorder: Secondary | ICD-10-CM

## 2020-09-29 DIAGNOSIS — E785 Hyperlipidemia, unspecified: Secondary | ICD-10-CM | POA: Diagnosis not present

## 2020-09-29 DIAGNOSIS — K5900 Constipation, unspecified: Secondary | ICD-10-CM

## 2020-09-29 DIAGNOSIS — E669 Obesity, unspecified: Secondary | ICD-10-CM

## 2020-09-29 DIAGNOSIS — Z6838 Body mass index (BMI) 38.0-38.9, adult: Secondary | ICD-10-CM

## 2020-09-29 DIAGNOSIS — F1010 Alcohol abuse, uncomplicated: Secondary | ICD-10-CM

## 2020-09-29 MED ORDER — LINACLOTIDE 145 MCG PO CAPS: 145.0000 ug | ORAL_CAPSULE | Freq: Every day | ORAL | 2 refills | Status: DC

## 2020-09-29 MED ORDER — VENLAFAXINE HCL ER 37.5 MG PO CP24: 37.5000 mg | ORAL_CAPSULE | Freq: Every day | ORAL | 2 refills | Status: DC

## 2020-09-29 MED ORDER — ATORVASTATIN CALCIUM 40 MG PO TABS: 40.0000 mg | ORAL_TABLET | Freq: Every day | ORAL | 1 refills | Status: DC

## 2020-09-29 NOTE — Progress Notes (Signed)
Established Patient Office Visit  Subjective:  Patient ID: Garrett Holmes, male    DOB: 1966-01-24  Age: 54 y.o. MRN: [id number]  CC:  Chief Complaint  Patient presents with   Hyperlipidemia   Depression    HPI Garrett Holmes presents for follow up on mood management and hyperlipidemia.  Mood: Reports he self-discontinued medication because he did not feel like the Prozac was doing anything for him. Couldn't tell a difference. States has a stressful job which triggers his anxiety. Drinking is his coping mechanism. Reports drinks on Fridays and Saturdays, about 8-9 beers. States he rarely takes Xanax.  HLD, Hypertriglyceridemia: Pt taking medication as directed without issues. Denies side effects. Requesting refill.  Constipation: Reports Linzess helps with bowel movements. He takes medication as needed.   Past Medical History:  Diagnosis Date   Allergy    Anxiety    Depression    Dyslipidemia    Hyperlipidemia     Past Surgical History:  Procedure Laterality Date   COLONOSCOPY  2012   HUNG    Family History  Problem Relation Age of Onset   Cancer Mother 43       Renal cancer    Social History   Socioeconomic History   Marital status: Married    Spouse name: Not on file   Number of children: Not on file   Years of education: Not on file   Highest education level: Not on file  Occupational History   Not on file  Tobacco Use   Smoking status: Former Smoker    Types: Cigarettes    Quit date: 12/17/2010    Years since quitting: 9.7   Smokeless tobacco: Never Used  Vaping Use   Vaping Use: Never used  Substance and Sexual Activity   Alcohol use: Yes    Alcohol/week: 1.0 standard drink    Types: 1 Cans of beer per week   Drug use: Not Currently    Types: Morphine   Sexual activity: Yes  Other Topics Concern   Not on file  Social History Narrative   Not on file   Social Determinants of Health   Financial Resource Strain:     Difficulty of Paying Living Expenses: Not on file  Food Insecurity:    Worried About [place] in the Last Year: Not on file   Ran Out of Food in the Last Year: Not on file  Transportation Needs:    Lack of Transportation (Medical): Not on file   Lack of Transportation (Non-Medical): Not on file  Physical Activity:    Days of Exercise per Week: Not on file   Minutes of Exercise per Session: Not on file  Stress:    Feeling of Stress : Not on file  Social Connections:    Frequency of Communication with Friends and Family: Not on file   Frequency of Social Gatherings with Friends and Family: Not on file   Attends Religious Services: Not on file   Active Member of Clubs or Organizations: Not on file   Attends Archivist Meetings: Not on file   Marital Status: Not on file  Intimate Partner Violence:    Fear of Current or Ex-Partner: Not on file   Emotionally Abused: Not on file   Physically Abused: Not on file   Sexually Abused: Not on file    Outpatient Medications Prior to Visit  Medication Sig Dispense Refill   atorvastatin (LIPITOR) 40 MG tablet Take 1 tablet (40  mg total) by mouth daily. 90 tablet 0   ALPRAZolam (XANAX) 0.25 MG tablet Take 1 tablet (0.25 mg total) by mouth 2 (two) times daily as needed for anxiety. (Patient not taking: Reported on 09/29/2020) 20 tablet 0   FLUoxetine (PROZAC) 20 MG capsule Take 1 capsule (20 mg total) by mouth daily. (Patient not taking: Reported on 09/29/2020) 90 capsule 0   Linaclotide (LINZESS) 145 MCG CAPS capsule Take 145 mcg by mouth daily. PRN per patient (Patient not taking: Reported on 09/29/2020)     No facility-administered medications prior to visit.    Allergies  Allergen Reactions   Sudafed [Pseudoephedrine Hcl]     Wires him up, agitation    ROS Review of Systems A fourteen system review of systems was performed and found to be positive as per HPI.   Objective:    Physical  Exam General:  Well Developed, well nourished, appropriate for stated age.  Neuro:  Alert and oriented,  extra-ocular muscles intact  HEENT:  Normocephalic, atraumatic, neck supple Skin:  no gross rash, warm, pink. Cardiac:  RRR, S1 S2 Respiratory:  ECTA B/L and A/P, Not using accessory muscles, speaking in full sentences- unlabored. Vascular:  Ext warm, no cyanosis apprec.; cap RF less 2 sec. No gross edema Psych:  No HI/SI, judgement and insight good, Euthymic mood. Full Affect.  BP 137/79    Pulse 81    Ht 5\' 8"  (1.727 m)    Wt 251 lb 1.6 oz (113.9 kg)    SpO2 97%    BMI 38.18 kg/m  Wt Readings from Last 3 Encounters:  09/29/20 251 lb 1.6 oz (113.9 kg)  06/29/20 244 lb 9.6 oz (110.9 kg)  02/23/20 256 lb (116.1 kg)     Health Maintenance Due  Topic Date Due   COVID-19 Vaccine (1) Never done   INFLUENZA VACCINE  Never done    There are no preventive care reminders to display for this patient.  Lab Results  Component Value Date   TSH 1.370 06/29/2020   Lab Results  Component Value Date   WBC 10.6 06/29/2020   HGB 15.1 06/29/2020   HCT 45.1 06/29/2020   MCV 90 06/29/2020   PLT 186 06/29/2020   Lab Results  Component Value Date   NA 139 06/29/2020   K 4.3 06/29/2020   CO2 23 06/29/2020   GLUCOSE 92 06/29/2020   BUN 13 06/29/2020   CREATININE 0.92 06/29/2020   BILITOT 0.6 06/29/2020   ALKPHOS 84 06/29/2020   AST 23 06/29/2020   ALT 33 06/29/2020   PROT 6.8 06/29/2020   ALBUMIN 4.4 06/29/2020   CALCIUM 9.3 06/29/2020   Lab Results  Component Value Date   CHOL 184 06/29/2020   Lab Results  Component Value Date   HDL 46 06/29/2020   Lab Results  Component Value Date   LDLCALC 99 06/29/2020   Lab Results  Component Value Date   TRIG 230 (H) 06/29/2020   Lab Results  Component Value Date   CHOLHDL 4.0 06/29/2020   Lab Results  Component Value Date   HGBA1C 5.6 06/29/2020      Assessment & Plan:   Problem List Items Addressed This Visit       Other   GAD (generalized anxiety disorder) - Primary (Chronic)    -Discussed with patient alternative management options including switching to another SSRI or trying SNRI.  Patient would like to try different class of medication, which is reasonable and will likely benefit from  SNRI so will start Effexor 37.5 mg.  Discussed potential side effects. Patient verbalized understanding.  Advised to let me know if unable to tolerate medication or for dose adjustment before next appointment. -Recommend to incorporate nonpharmacological therapy such as relaxation techniques and exercise. -Follow-up in 3 months to reassess symptoms and medication therapy.      Relevant Medications   venlafaxine XR (EFFEXOR XR) 37.5 MG 24 hr capsule   Hypertriglyceridemia >er 500 (Chronic)    -Last triglycerides elevated but improved from prior. -Recommend to reduce simple carbohydrates and continue to work on reducing alcohol. -Will continue to monitor.      Relevant Medications   atorvastatin (LIPITOR) 40 MG tablet   Hyperlipidemia    -Last lipid panel stable. -Continue current medication regimen.  Provided refill. -Follow a heart healthy diet and stay as active as possible. -Will continue to monitor.      Relevant Medications   atorvastatin (LIPITOR) 40 MG tablet   Constipation    -Stable -Continue current medication regimen. Provided refill. -Recommend good hydration and dietary fiber.      Relevant Medications   linaclotide (LINZESS) 145 MCG CAPS capsule   Excessive drinking alcohol on Fri & Sat    -Recommend to reduce alcohol intake and find alternative coping mechanisms for stress such as mindfulness therapy or exercise. -Last hepatic function wnl -Will continue to monitor.       Other Visit Diagnoses    Influenza vaccination declined by patient       Class 2 obesity with body mass index (BMI) of 38.0 to 38.9 in adult, unspecified obesity type, unspecified whether serious comorbidity  present         Class 2 obesity: -Patient reports stress eating and recommend to increase physical activity to help with stress and weight loss. -Recommend dietary changes.  Influenza vaccination declined: -Patient reports he doesn't get his flu vaccine and not interested at this time.  Meds ordered this encounter  Medications   atorvastatin (LIPITOR) 40 MG tablet    Sig: Take 1 tablet (40 mg total) by mouth daily.    Dispense:  90 tablet    Refill:  1    Order Specific Question:   Supervising Provider    Answer:   Hali Marry [[id number]]   linaclotide (LINZESS) 145 MCG CAPS capsule    Sig: Take 1 capsule (145 mcg total) by mouth daily. PRN per patient    Dispense:  30 capsule    Refill:  2    Order Specific Question:   Supervising Provider    Answer:   Beatrice Lecher D [[id number]]   venlafaxine XR (EFFEXOR XR) 37.5 MG 24 hr capsule    Sig: Take 1 capsule (37.5 mg total) by mouth daily with breakfast.    Dispense:  30 capsule    Refill:  2    Order Specific Question:   Supervising Provider    Answer:   Beatrice Lecher D [[id number]]    Follow-up: Return in about 3 months (around 12/30/2020) for Mood- changed med.   Note:  This note was prepared with assistance of Dragon voice recognition software. Occasional wrong-word or sound-a-like substitutions may have occurred due to the inherent limitations of voice recognition software.  Lorrene Reid, PA-C

## 2020-09-29 NOTE — Assessment & Plan Note (Signed)
-  Recommend to reduce alcohol intake and find alternative coping mechanisms for stress such as mindfulness therapy or exercise. -Last hepatic function wnl -Will continue to monitor.

## 2020-09-29 NOTE — Assessment & Plan Note (Signed)
-  Stable -Continue current medication regimen. Provided refill. -Recommend good hydration and dietary fiber.

## 2020-09-29 NOTE — Patient Instructions (Signed)
High Triglycerides Eating Plan Triglycerides are a type of fat in the blood. High levels of triglycerides can increase your risk of heart disease and stroke. If your triglyceride levels are high, choosing the right foods can help lower your triglycerides and keep your heart healthy. Work with your health care provider or a diet and nutrition specialist (dietitian) to develop an eating plan that is right for you. What are tips for following this plan? General guidelines   Lose weight, if you are overweight. For most people, losing 5-10 lbs (2-5 kg) helps lower triglyceride levels. A weight-loss plan may include. ? 30 minutes of exercise at least 5 days a week. ? Reducing the amount of calories, sugar, and fat you eat.  Eat a wide variety of fresh fruits, vegetables, and whole grains. These foods are high in fiber.  Eat foods that contain healthy fats, such as fatty fish, nuts, seeds, and olive oil.  Avoid foods that are high in added sugar, added salt (sodium), saturated fat, and trans fat.  Avoid low-fiber, refined carbohydrates such as white bread, crackers, noodles, and white rice.  Avoid foods with partially hydrogenated oils (trans fats), such as fried foods or stick margarine.  Limit alcohol intake to no more than 1 drink a day for nonpregnant women and 2 drinks a day for men. One drink equals 12 oz of beer, 5 oz of wine, or 1 oz of hard liquor. Your health care provider may recommend that you drink less depending on your overall health. Reading food labels  Check food labels for the amount of saturated fat. Choose foods with no or very little saturated fat.  Check food labels for the amount of trans fat. Choose foods with no trans fat.  Check food labels for the amount of cholesterol. Choose foods low in cholesterol. Ask your dietitian how much cholesterol you should have each day.  Check food labels for the amount of sodium. Choose foods with less than 140 milligrams (mg) per  serving. Shopping  Buy dairy products labeled as nonfat (skim) or low-fat (1%).  Avoid buying processed or prepackaged foods. These are often high in added sugar, sodium, and fat. Cooking  Choose healthy fats when cooking, such as olive oil or canola oil.  Cook foods using lower fat methods, such as baking, broiling, boiling, or grilling.  Make your own sauces, dressings, and marinades when possible, instead of buying them. Store-bought sauces, dressings, and marinades are often high in sodium and sugar. Meal planning  Eat more home-cooked food and less restaurant, buffet, and fast food.  Eat fatty fish at least 2 times each week. Examples of fatty fish include salmon, trout, mackerel, tuna, and herring.  If you eat whole eggs, do not eat more than 3 egg yolks per week. What foods are recommended? The items listed may not be a complete list. Talk with your dietitian about what dietary choices are best for you. Grains Whole wheat or whole grain breads, crackers, cereals, and pasta. Unsweetened oatmeal. Bulgur. Barley. Quinoa. Brown rice. Whole wheat flour tortillas. Vegetables Fresh or frozen vegetables. Low-sodium canned vegetables. Fruits All fresh, canned (in natural juice), or frozen fruits. Meats and other protein foods Skinless chicken or [place]. Ground chicken or [place]. Lean cuts of pork, trimmed of fat. Fish and seafood, especially salmon, trout, and herring. Egg whites. Dried beans, peas, or lentils. Unsalted nuts or seeds. Unsalted canned beans. Natural peanut or almond butter. Dairy Low-fat dairy products. Skim or low-fat (1%) milk. Reduced fat (  2%) and low-sodium cheese. Low-fat ricotta cheese. Low-fat cottage cheese. Plain, low-fat yogurt. Fats and oils Tub margarine without trans fats. Light or reduced-fat mayonnaise. Light or reduced-fat salad dressings. Avocado. Safflower, olive, sunflower, soybean, and canola oils. What foods are not recommended? The items listed  may not be a complete list. Talk with your dietitian about what dietary choices are best for you. Grains White bread. White (regular) pasta. White rice. Cornbread. Bagels. Pastries. Crackers that contain trans fat. Vegetables Creamed or fried vegetables. Vegetables in a cheese sauce. Fruits Sweetened dried fruit. Canned fruit in syrup. Fruit juice. Meats and other protein foods Fatty cuts of meat. Ribs. Chicken wings. Berniece Salines. Sausage. Bologna. Salami. Chitterlings. Fatback. Hot dogs. Bratwurst. Packaged lunch meats. Dairy Whole or reduced-fat (2%) milk. Half-and-half. Cream cheese. Full-fat or sweetened yogurt. Full-fat cheese. Nondairy creamers. Whipped toppings. Processed cheese or cheese spreads. Cheese curds. Beverages Alcohol. Sweetened drinks, such as soda, lemonade, fruit drinks, or punches. Fats and oils Butter. Stick margarine. Lard. Shortening. Ghee. Bacon fat. Tropical oils, such as coconut, palm kernel, or palm oils. Sweets and desserts Corn syrup. Sugars. Honey. Molasses. Candy. Jam and jelly. Syrup. Sweetened cereals. Cookies. Pies. Cakes. Donuts. Muffins. Ice cream. Condiments Store-bought sauces, dressings, and marinades that are high in sugar, such as ketchup and barbecue sauce. Summary  High levels of triglycerides can increase the risk of heart disease and stroke. Choosing the right foods can help lower your triglycerides.  Eat plenty of fresh fruits, vegetables, and whole grains. Choose low-fat dairy and lean meats. Eat fatty fish at least twice a week.  Avoid processed and prepackaged foods with added sugar, sodium, saturated fat, and trans fat.  If you need suggestions or have questions about what types of food are good for you, talk with your health care provider or a dietitian. This information is not intended to replace advice given to you by your health care provider. Make sure you discuss any questions you have with your health care provider. Document Revised:  11/15/2017 Document Reviewed: 02/05/2017 Elsevier Patient Education  [place], Adult After being diagnosed with an anxiety disorder, you may be relieved to know why you have felt or behaved a certain way. You may also feel overwhelmed about the treatment ahead and what it will mean for your life. With care and support, you can manage this condition and recover from it. How to manage lifestyle changes Managing stress and anxiety  Stress is your body's reaction to life changes and events, both good and bad. Most stress will last just a few hours, but stress can be ongoing and can lead to more than just stress. Although stress can play a major role in anxiety, it is not the same as anxiety. Stress is usually caused by something external, such as a deadline, test, or competition. Stress normally passes after the triggering event has ended.  Anxiety is caused by something internal, such as imagining a terrible outcome or worrying that something will go wrong that will devastate you. Anxiety often does not go away even after the triggering event is over, and it can become long-term (chronic) worry. It is important to understand the differences between stress and anxiety and to manage your stress effectively so that it does not lead to an anxious response. Talk with your health care provider or a counselor to learn more about reducing anxiety and stress. He or she may suggest tension reduction techniques, such as:  Music therapy. This can include  creating or listening to music that you enjoy and that inspires you.  Mindfulness-based meditation. This involves being aware of your normal breaths while not trying to control your breathing. It can be done while sitting or walking.  Centering prayer. This involves focusing on a word, phrase, or sacred image that means something to you and brings you peace.  Deep breathing. To do this, expand your stomach and inhale slowly through  your nose. Hold your breath for 3-5 seconds. Then exhale slowly, letting your stomach muscles relax.  Self-talk. This involves identifying thought patterns that lead to anxiety reactions and changing those patterns.  Muscle relaxation. This involves tensing muscles and then relaxing them. Choose a tension reduction technique that suits your lifestyle and personality. These techniques take time and practice. Set aside 5-15 minutes a day to do them. Therapists can offer counseling and training in these techniques. The training to help with anxiety may be covered by some insurance plans. Other things you can do to manage stress and anxiety include:  Keeping a stress/anxiety diary. This can help you learn what triggers your reaction and then learn ways to manage your response.  Thinking about how you react to certain situations. You may not be able to control everything, but you can control your response.  Making time for activities that help you relax and not feeling guilty about spending your time in this way.  Visual imagery and yoga can help you stay calm and relax.  Medicines Medicines can help ease symptoms. Medicines for anxiety include:  Anti-anxiety drugs.  Antidepressants. Medicines are often used as a primary treatment for anxiety disorder. Medicines will be prescribed by a health care provider. When used together, medicines, psychotherapy, and tension reduction techniques may be the most effective treatment. Relationships Relationships can play a big part in helping you recover. Try to spend more time connecting with trusted friends and family members. Consider going to couples counseling, taking family education classes, or going to family therapy. Therapy can help you and others better understand your condition. How to recognize changes in your anxiety Everyone responds differently to treatment for anxiety. Recovery from anxiety happens when symptoms decrease and stop interfering  with your daily activities at home or work. This may mean that you will start to:  Have better concentration and focus. Worry will interfere less in your daily thinking.  Sleep better.  Be less irritable.  Have more energy.  Have improved memory. It is important to recognize when your condition is getting worse. Contact your health care provider if your symptoms interfere with home or work and you feel like your condition is not improving. Follow these instructions at home: Activity  Exercise. Most adults should do the following: ? Exercise for at least 150 minutes each week. The exercise should increase your heart rate and make you sweat (moderate-intensity exercise). ? Strengthening exercises at least twice a week.  Get the right amount and quality of sleep. Most adults need 7-9 hours of sleep each night. Lifestyle   Eat a healthy diet that includes plenty of vegetables, fruits, whole grains, low-fat dairy products, and lean protein. Do not eat a lot of foods that are high in solid fats, added sugars, or salt.  Make choices that simplify your life.  Do not use any products that contain nicotine or tobacco, such as cigarettes, e-cigarettes, and chewing tobacco. If you need help quitting, ask your health care provider.  Avoid caffeine, alcohol, and certain over-the-counter cold medicines. These may  make you feel worse. Ask your pharmacist which medicines to avoid. General instructions  Take over-the-counter and prescription medicines only as told by your health care provider.  Keep all follow-up visits as told by your health care provider. This is important. Where to find support You can get help and support from these sources:  Self-help groups.  Online and [employer].  A trusted spiritual leader.  Couples counseling.  Family education classes.  Family therapy. Where to find more information You may find that joining a support group helps you deal with  your anxiety. The following sources can help you locate counselors or support groups near you:  [place]: www.mentalhealth[place].net  Anxiety and Depression Association of [place] (ADAA): https://www.clark.net/  National Alliance on Mental Illness (NAMI): www.nami.org Contact a health care provider if you:  Have a hard time staying focused or finishing daily tasks.  Spend many hours a day feeling worried about everyday life.  Become exhausted by worry.  Start to have headaches, feel tense, or have nausea.  Urinate more than normal.  Have diarrhea. Get help right away if you have:  A racing heart and shortness of breath.  Thoughts of hurting yourself or others. If you ever feel like you may hurt yourself or others, or have thoughts about taking your own life, get help right away. You can go to your nearest emergency department or call:  Your local emergency services (911 in the [place]  A suicide crisis helpline, such as the [place] at 782-294-7036. This is open 24 hours a day. Summary  Taking steps to learn and use tension reduction techniques can help calm you and help prevent triggering an anxiety reaction.  When used together, medicines, psychotherapy, and tension reduction techniques may be the most effective treatment.  Family, friends, and partners can play a big part in helping you recover from an anxiety disorder. This information is not intended to replace advice given to you by your health care provider. Make sure you discuss any questions you have with your health care provider. Document Revised: 05/05/2019 Document Reviewed: 05/05/2019 Elsevier Patient Education  [place].

## 2020-09-29 NOTE — Assessment & Plan Note (Signed)
-  Last triglycerides elevated but improved from prior. -Recommend to reduce simple carbohydrates and continue to work on reducing alcohol. -Will continue to monitor.

## 2020-09-29 NOTE — Assessment & Plan Note (Signed)
-  Discussed with patient alternative management options including switching to another SSRI or trying SNRI.  Patient would like to try different class of medication, which is reasonable and will likely benefit from SNRI so will start Effexor 37.5 mg.  Discussed potential side effects. Patient verbalized understanding.  Advised to let me know if unable to tolerate medication or for dose adjustment before next appointment. -Recommend to incorporate nonpharmacological therapy such as relaxation techniques and exercise. -Follow-up in 3 months to reassess symptoms and medication therapy.

## 2020-09-29 NOTE — Assessment & Plan Note (Addendum)
>>  ASSESSMENT AND PLAN FOR HYPERLIPIDEMIA WRITTEN ON 09/29/2020  9:55 AM BY Anielle Headrick, PA-C  -Last lipid panel stable. -Continue current medication regimen.  Provided refill. -Follow a heart healthy diet and stay as active as possible. -Will continue to monitor.  >>ASSESSMENT AND PLAN FOR HYPERTRIGLYCERIDEMIA >ER 500 WRITTEN ON 09/29/2020  9:56 AM BY Demisha Nokes, PA-C  -Last triglycerides elevated but improved from prior. -Recommend to reduce simple carbohydrates and continue to work on reducing alcohol. -Will continue to monitor.

## 2020-12-26 ENCOUNTER — Encounter: Payer: Self-pay | Admitting: Dermatology

## 2020-12-26 ENCOUNTER — Other Ambulatory Visit: Payer: Self-pay

## 2020-12-26 ENCOUNTER — Ambulatory Visit (INDEPENDENT_AMBULATORY_CARE_PROVIDER_SITE_OTHER): Payer: Commercial Managed Care - PPO | Admitting: Dermatology

## 2020-12-26 ENCOUNTER — Ambulatory Visit: Payer: Commercial Managed Care - PPO | Admitting: Dermatology

## 2020-12-26 DIAGNOSIS — Z1283 Encounter for screening for malignant neoplasm of skin: Secondary | ICD-10-CM

## 2020-12-26 DIAGNOSIS — D485 Neoplasm of uncertain behavior of skin: Secondary | ICD-10-CM | POA: Diagnosis not present

## 2020-12-26 NOTE — Patient Instructions (Signed)

## 2020-12-27 ENCOUNTER — Encounter: Payer: Self-pay | Admitting: Dermatology

## 2020-12-27 NOTE — Progress Notes (Signed)
   New Patient   Subjective  Garrett Holmes is a 55 y.o. male who presents for the following: Annual Exam (Crown of scalp crust, forehead crust, right eye lid tan spot/Previous dermatology Dr Allyson Sabal previous ln2 on face).  General skin examination Location:  Duration:  Quality: New growths on right forehead and back of scalp. Associated Signs/Symptoms: Modifying Factors:  Severity:  Timing: Context: History of having places treated with freezing Dr. Ledell Peoples.   The following portions of the chart were reviewed this encounter and updated as appropriate:  Tobacco  Allergies  Meds  Problems  Med Hx  Surg Hx  Fam Hx      Objective  Well appearing patient in no apparent distress; mood and affect are within normal limits. Objective  Right Forehead: Pink thick 5 mm crusted     Objective  Mid Occipital Scalp: 6 mm thick pink crusted     Objective  Chest - Medial [facility]): Waist up skin examination.  No atypical moles or melanoma.   A full examination was performed including scalp, head, eyes, ears, nose, lips, neck, chest, axillae, abdomen, back, buttocks, bilateral upper extremities, bilateral lower extremities, hands, feet, fingers, toes, fingernails, and toenails. All findings within normal limits unless otherwise noted below.   Assessment & Plan  Neoplasm of uncertain behavior of skin (2) Right Forehead  Skin / nail biopsy Type of biopsy: tangential   Informed consent: discussed and consent obtained   Timeout: patient name, date of birth, surgical site, and procedure verified   Procedure prep:  Patient was prepped and draped in usual sterile fashion (Non sterile) Prep type:  Chlorhexidine Anesthesia: the lesion was anesthetized in a standard fashion   Anesthetic:  1% lidocaine w/ epinephrine 1-100,000 local infiltration Instrument used: flexible razor blade   Outcome: patient tolerated procedure well   Post-procedure details: wound care instructions given     Specimen 1 - Surgical pathology Differential Diagnosis:bcc scc ak  Check Margins: No  Mid Occipital Scalp  Skin / nail biopsy Type of biopsy: tangential   Informed consent: discussed and consent obtained   Timeout: patient name, date of birth, surgical site, and procedure verified   Procedure prep:  Patient was prepped and draped in usual sterile fashion (Non sterile) Prep type:  Chlorhexidine Anesthesia: the lesion was anesthetized in a standard fashion   Anesthetic:  1% lidocaine w/ epinephrine 1-100,000 local infiltration Instrument used: flexible razor blade   Outcome: patient tolerated procedure well   Post-procedure details: wound care instructions given    Specimen 2 - Surgical pathology Differential Diagnosis: bcc scc ak  Check Margins: No  Screening exam for skin cancer Chest - Medial (Center)  Annual skin examination.  Encouraged to self examine twice annually

## 2021-01-05 ENCOUNTER — Ambulatory Visit (INDEPENDENT_AMBULATORY_CARE_PROVIDER_SITE_OTHER): Payer: Commercial Managed Care - PPO | Admitting: Physician Assistant

## 2021-01-05 ENCOUNTER — Other Ambulatory Visit: Payer: Self-pay

## 2021-01-05 ENCOUNTER — Encounter: Payer: Self-pay | Admitting: Physician Assistant

## 2021-01-05 VITALS — BP 137/87 | HR 62 | Ht 68.0 in | Wt 262.1 lb

## 2021-01-05 DIAGNOSIS — E785 Hyperlipidemia, unspecified: Secondary | ICD-10-CM

## 2021-01-05 DIAGNOSIS — R0600 Dyspnea, unspecified: Secondary | ICD-10-CM | POA: Diagnosis not present

## 2021-01-05 DIAGNOSIS — R0609 Other forms of dyspnea: Secondary | ICD-10-CM

## 2021-01-05 DIAGNOSIS — F411 Generalized anxiety disorder: Secondary | ICD-10-CM

## 2021-01-05 DIAGNOSIS — E781 Pure hyperglyceridemia: Secondary | ICD-10-CM | POA: Diagnosis not present

## 2021-01-05 DIAGNOSIS — G4700 Insomnia, unspecified: Secondary | ICD-10-CM

## 2021-01-05 MED ORDER — VENLAFAXINE HCL ER 75 MG PO CP24: 75.0000 mg | ORAL_CAPSULE | Freq: Every day | ORAL | 2 refills | Status: DC

## 2021-01-05 NOTE — Progress Notes (Signed)
Established Patient Office Visit  Subjective:  Patient ID: Garrett Holmes, male    DOB: Sep 13, 1966  Age: 55 y.o. MRN: [id number]  CC:  Chief Complaint  Patient presents with  . Hyperlipidemia  . Depression    HPI Garrett Holmes presents for follow up on mood and hyperlipidemia. Has c/o of shortness of breath with exertion that has been more frequent within the past month. Denies chest pain, palpitations, fever, cough, arm/jaw pain, recent travel, lower extremity swelling or pain, or N/V. Had an episode of feeling light headed for a day or two which occurred with head movement, has resolved. Former smoker. States drinking is the same, has not reduced use. No family history of heart disease, CVA or CAD.  Mood: States stress levels continue to be the same. Tolerating medication without issues. Feels like anxiety has mildly improved since starting Venlafaxine 37.5 mg.   Hyperlipidemia: Pt taking medication as directed without issues. Denies side effects including myalgias and RUQ pain. States needs to improve diet and reduce eating out. Staying busy with work makes it challenging.    Insomnia: States has no problem with falling asleep, but cannot stay asleep. Has tried multiple OTC sleep aids including melatonin with minimal relief. Has been taking benadryl which dose help with staying asleep.   Past Medical History:  Diagnosis Date  . Allergy   . Anxiety   . Depression   . Dyslipidemia   . Hyperlipidemia     Past Surgical History:  Procedure Laterality Date  . COLONOSCOPY  2012   HUNG    Family History  Problem Relation Age of Onset  . Cancer Mother 11       Renal cancer    Social History   Socioeconomic History  . Marital status: Married    Spouse name: Not on file  . Number of children: Not on file  . Years of education: Not on file  . Highest education level: Not on file  Occupational History  . Not on file  Tobacco Use  . Smoking status: Former Smoker    Types:  Cigarettes    Quit date: 12/17/2010    Years since quitting: 10.0  . Smokeless tobacco: Never Used  Vaping Use  . Vaping Use: Never used  Substance and Sexual Activity  . Alcohol use: Yes    Alcohol/week: 1.0 standard drink    Types: 1 Cans of beer per week  . Drug use: Not Currently    Types: Morphine  . Sexual activity: Yes  Other Topics Concern  . Not on file  Social History Narrative  . Not on file   Social Determinants of Health   Financial Resource Strain: Not on file  Food Insecurity: Not on file  Transportation Needs: Not on file  Physical Activity: Not on file  Stress: Not on file  Social Connections: Not on file  Intimate Partner Violence: Not on file    Outpatient Medications Prior to Visit  Medication Sig Dispense Refill  . ALPRAZolam (XANAX) 0.25 MG tablet Take 1 tablet (0.25 mg total) by mouth 2 (two) times daily as needed for anxiety. 20 tablet 0  . atorvastatin (LIPITOR) 40 MG tablet Take 1 tablet (40 mg total) by mouth daily. 90 tablet 1  . linaclotide (LINZESS) 145 MCG CAPS capsule Take 1 capsule (145 mcg total) by mouth daily. PRN per patient 30 capsule 2  . venlafaxine XR (EFFEXOR XR) 37.5 MG 24 hr capsule Take 1 capsule (37.5 mg total) by  mouth daily with breakfast. 30 capsule 2   No facility-administered medications prior to visit.    Allergies  Allergen Reactions  . Sudafed [Pseudoephedrine Hcl]     Wires him up, agitation    ROS Review of Systems A fourteen system review of systems was performed and found to be positive as per HPI.  Objective:    Physical Exam General:  Well Developed, well nourished, appropriate for stated age.  Neuro:  Alert and oriented,  extra-ocular muscles intact  HEENT:  Normocephalic, atraumatic, neck supple, no JVD appreciated  Skin:  no gross rash, warm, pink. Cardiac:  Regular rhythm with slight bradycardia, S1 S2 wnl's Respiratory:  ECTA w/o wheezing, Not using accessory muscles, speaking in full sentences-  unlabored. Vascular:  Ext warm, no cyanosis apprec.; cap RF less 2 sec. No edema Psych:  No HI/SI, judgement and insight good, Euthymic mood. Full Affect.   BP 137/87   Pulse 62   Ht _0  (1.727 m)   Wt 262 lb 1.6 oz (118.9 kg)   SpO2 97%   BMI 39.85 kg/m  Wt Readings from Last 3 Encounters:  01/05/21 262 lb 1.6 oz (118.9 kg)  09/29/20 251 lb 1.6 oz (113.9 kg)  06/29/20 244 lb 9.6 oz (110.9 kg)     Health Maintenance Due  Topic Date Due  . COVID-19 Vaccine (1) Never done  . INFLUENZA VACCINE  Never done    There are no preventive care reminders to display for this patient.  Lab Results  Component Value Date   TSH 1.370 06/29/2020   Lab Results  Component Value Date   WBC 10.6 06/29/2020   HGB 15.1 06/29/2020   HCT 45.1 06/29/2020   MCV 90 06/29/2020   PLT 186 06/29/2020   Lab Results  Component Value Date   NA 139 06/29/2020   K 4.3 06/29/2020   CO2 23 06/29/2020   GLUCOSE 92 06/29/2020   BUN 13 06/29/2020   CREATININE 0.92 06/29/2020   BILITOT 0.6 06/29/2020   ALKPHOS 84 06/29/2020   AST 23 06/29/2020   ALT 33 06/29/2020   PROT 6.8 06/29/2020   ALBUMIN 4.4 06/29/2020   CALCIUM 9.3 06/29/2020   Lab Results  Component Value Date   CHOL 184 06/29/2020   Lab Results  Component Value Date   HDL 46 06/29/2020   Lab Results  Component Value Date   LDLCALC 99 06/29/2020   Lab Results  Component Value Date   TRIG 230 (H) 06/29/2020   Lab Results  Component Value Date   CHOLHDL 4.0 06/29/2020   Lab Results  Component Value Date   HGBA1C 5.6 06/29/2020      Assessment & Plan:   Problem List Items Addressed This Visit      Other   GAD (generalized anxiety disorder) (Chronic)   Relevant Medications   venlafaxine XR (EFFEXOR XR) 75 MG 24 hr capsule   Hypertriglyceridemia >er 500 (Chronic)   Hyperlipidemia - Primary    Other Visit Diagnoses    Dyspnea on exertion       Relevant Orders   DG Chest 2 View   CBC w/Diff   Comp Met (CMET)    TSH   B Nat Peptide   Insomnia, unspecified type         Dyspnea on exertion: -EKG performed: Sinus bradycardia, rate 55, no acute ST-T wave changes. -Will place orders for chest xray, CBC w/ diff, CMP, BNP and TSH to evaluate for possible etiologies such as  cardiopulmonary, thyroid disorder, anemia or electrolyte imbalance. If initial work-up is negative, patient is agreeable for further evaluation such as exercise stress test. -Discussed red flag signs/symptoms to monitor for and recommend to seek immediate medical care. -Patient has gained 11 pounds since last visit so possibly deconditioning and obesity contributing to symptoms.  -Patient has risk factors for CAD and recommend to follow a heart healthy diet and increase physical activity which will also help with weight loss.  GAD:  -PHQ-9 score of 1. -Discussed with patient adjusting medication dose of Venlafaxine to help improve anxiety and is agreeable. Will send new rx for Venlafaxine 75 mg. Advised to let me know if unable to tolerated increased dose and will resume 37.5 mg. -Will continue to monitor.  Hyperlipidemia, Hypertriglyceridemia:  -Last lipid panel: total cholesterol 184, triglycerides 230, HDL 46, LDL 99 The 10-year ASCVD risk score Mikey Bussing DC Jr., et al., 2013) is: 4.6%  -Continue atorvastatin 40 mg. -Encourage to reduce simple carbohydrates, saturated and trans fats. Recommend to continue to work on alcohol cessation. -Will continue to monitor.  Insomnia: -Advised to try melatonin extended/time release and recommend to limit/reduce caffeine use and try to establish a good sleep hygiene. -Will continue to monitor and reassess at follow up visit.     Meds ordered this encounter  Medications  . venlafaxine XR (EFFEXOR XR) 75 MG 24 hr capsule    Sig: Take 1 capsule (75 mg total) by mouth daily with breakfast.    Dispense:  30 capsule    Refill:  2    Order Specific Question:   Supervising Provider    Answer:    Beatrice Lecher D [[id number]]    Follow-up: Return in about 3 months (around 04/05/2021) for Mood- inc med, Insomnia .    Lorrene Reid, PA-C

## 2021-01-05 NOTE — Patient Instructions (Addendum)
Shortness of Breath, Adult Shortness of breath is when a person has trouble breathing enough air or when a person feels like she or he is having trouble breathing in enough air. Shortness of breath could be a sign of a medical problem. Follow these instructions at home:  Pay attention to any changes in your symptoms.  Do not use any products that contain nicotine or tobacco, such as cigarettes, e-cigarettes, and chewing tobacco.  Do not smoke. Smoking is a common cause of shortness of breath. If you need help quitting, ask your health care provider.  Avoid things that can irritate your airways, such as: ? Mold. ? Dust. ? Air pollution. ? Chemical fumes. ? Things that can cause allergy symptoms (allergens), if you have allergies.  Keep your living space clean and free of mold and dust.  Rest as needed. Slowly return to your usual activities.  Take over-the-counter and prescription medicines only as told by your health care provider. This includes oxygen therapy and inhaled medicines.  Keep all follow-up visits as told by your health care provider. This is important.   Contact a health care provider if:  Your condition does not improve as soon as expected.  You have a hard time doing your normal activities, even after you rest.  You have new symptoms. Get help right away if:  Your shortness of breath gets worse.  You have shortness of breath when you are resting.  You feel light-headed or you faint.  You have a cough that is not controlled with medicines.  You cough up blood.  You have pain with breathing.  You have pain in your chest, arms, shoulders, or abdomen.  You have a fever.  You cannot walk up stairs or exercise the way that you normally do. These symptoms may represent a serious problem that is an emergency. Do not wait to see if the symptoms will go away. Get medical help right away. Call your local emergency services (911 in the [place] Do not drive yourself  to the hospital. Summary  Shortness of breath is when a person has trouble breathing enough air. It can be a sign of a medical problem.  Avoid things that irritate your lungs, such as smoking, pollution, mold, and dust.  Pay attention to changes in your symptoms and contact your health care provider if you have a hard time completing daily activities because of shortness of breath. This information is not intended to replace advice given to you by your health care provider. Make sure you discuss any questions you have with your health care provider. Document Revised: 05/05/2018 Document Reviewed: 05/05/2018 Elsevier Patient Education  2021 [place], Adult After being diagnosed with an anxiety disorder, you may be relieved to know why you have felt or behaved a certain way. You may also feel overwhelmed about the treatment ahead and what it will mean for your life. With care and support, you can manage this condition and recover from it. How to manage lifestyle changes Managing stress and anxiety Stress is your body's reaction to life changes and events, both good and bad. Most stress will last just a few hours, but stress can be ongoing and can lead to more than just stress. Although stress can play a major role in anxiety, it is not the same as anxiety. Stress is usually caused by something external, such as a deadline, test, or competition. Stress normally passes after the triggering event has ended.  Anxiety is  caused by something internal, such as imagining a terrible outcome or worrying that something will go wrong that will devastate you. Anxiety often does not go away even after the triggering event is over, and it can become long-term (chronic) worry. It is important to understand the differences between stress and anxiety and to manage your stress effectively so that it does not lead to an anxious response. Talk with your health care provider or a counselor to  learn more about reducing anxiety and stress. He or she may suggest tension reduction techniques, such as:  Music therapy. This can include creating or listening to music that you enjoy and that inspires you.  Mindfulness-based meditation. This involves being aware of your normal breaths while not trying to control your breathing. It can be done while sitting or walking.  Centering prayer. This involves focusing on a word, phrase, or sacred image that means something to you and brings you peace.  Deep breathing. To do this, expand your stomach and inhale slowly through your nose. Hold your breath for 3-5 seconds. Then exhale slowly, letting your stomach muscles relax.  Self-talk. This involves identifying thought patterns that lead to anxiety reactions and changing those patterns.  Muscle relaxation. This involves tensing muscles and then relaxing them. Choose a tension reduction technique that suits your lifestyle and personality. These techniques take time and practice. Set aside 5-15 minutes a day to do them. Therapists can offer counseling and training in these techniques. The training to help with anxiety may be covered by some insurance plans. Other things you can do to manage stress and anxiety include:  Keeping a stress/anxiety diary. This can help you learn what triggers your reaction and then learn ways to manage your response.  Thinking about how you react to certain situations. You may not be able to control everything, but you can control your response.  Making time for activities that help you relax and not feeling guilty about spending your time in this way.  Visual imagery and yoga can help you stay calm and relax.   Medicines Medicines can help ease symptoms. Medicines for anxiety include:  Anti-anxiety drugs.  Antidepressants. Medicines are often used as a primary treatment for anxiety disorder. Medicines will be prescribed by a health care provider. When used together,  medicines, psychotherapy, and tension reduction techniques may be the most effective treatment. Relationships Relationships can play a big part in helping you recover. Try to spend more time connecting with trusted friends and family members. Consider going to couples counseling, taking family education classes, or going to family therapy. Therapy can help you and others better understand your condition. How to recognize changes in your anxiety Everyone responds differently to treatment for anxiety. Recovery from anxiety happens when symptoms decrease and stop interfering with your daily activities at home or work. This may mean that you will start to:  Have better concentration and focus. Worry will interfere less in your daily thinking.  Sleep better.  Be less irritable.  Have more energy.  Have improved memory. It is important to recognize when your condition is getting worse. Contact your health care provider if your symptoms interfere with home or work and you feel like your condition is not improving. Follow these instructions at home: Activity  Exercise. Most adults should do the following: ? Exercise for at least 150 minutes each week. The exercise should increase your heart rate and make you sweat (moderate-intensity exercise). ? Strengthening exercises at least twice a week.  Get the right amount and quality of sleep. Most adults need 7-9 hours of sleep each night. Lifestyle  Eat a healthy diet that includes plenty of vegetables, fruits, whole grains, low-fat dairy products, and lean protein. Do not eat a lot of foods that are high in solid fats, added sugars, or salt.  Make choices that simplify your life.  Do not use any products that contain nicotine or tobacco, such as cigarettes, e-cigarettes, and chewing tobacco. If you need help quitting, ask your health care provider.  Avoid caffeine, alcohol, and certain over-the-counter cold medicines. These may make you feel  worse. Ask your pharmacist which medicines to avoid.   General instructions  Take over-the-counter and prescription medicines only as told by your health care provider.  Keep all follow-up visits as told by your health care provider. This is important. Where to find support You can get help and support from these sources:  Self-help groups.  Online and [employer].  A trusted spiritual leader.  Couples counseling.  Family education classes.  Family therapy. Where to find more information You may find that joining a support group helps you deal with your anxiety. The following sources can help you locate counselors or support groups near you:  [place]: www.mentalhealthamerica.net  Anxiety and Depression Association of [place] (ADAA): https://www.clark.net/  National Alliance on Mental Illness (NAMI): www.nami.org Contact a health care provider if you:  Have a hard time staying focused or finishing daily tasks.  Spend many hours a day feeling worried about everyday life.  Become exhausted by worry.  Start to have headaches, feel tense, or have nausea.  Urinate more than normal.  Have diarrhea. Get help right away if you have:  A racing heart and shortness of breath.  Thoughts of hurting yourself or others. If you ever feel like you may hurt yourself or others, or have thoughts about taking your own life, get help right away. You can go to your nearest emergency department or call:  Your local emergency services (911 in the [place]  A suicide crisis helpline, such as the [facility] at 251-291-0515. This is open 24 hours a day. Summary  Taking steps to learn and use tension reduction techniques can help calm you and help prevent triggering an anxiety reaction.  When used together, medicines, psychotherapy, and tension reduction techniques may be the most effective treatment.  Family, friends, and partners can play a big  part in helping you recover from an anxiety disorder. This information is not intended to replace advice given to you by your health care provider. Make sure you discuss any questions you have with your health care provider. Document Revised: 05/05/2019 Document Reviewed: 05/05/2019 Elsevier Patient Education  [place].

## 2021-01-06 LAB — COMPREHENSIVE METABOLIC PANEL
ALT: 37 IU/L (ref 0–44)
AST: 27 IU/L (ref 0–40)
Albumin/Globulin Ratio: 1.7 (ref 1.2–2.2)
Albumin: 4.2 g/dL (ref 3.8–4.9)
Alkaline Phosphatase: 82 IU/L (ref 44–121)
BUN/Creatinine Ratio: 10 (ref 9–20)
BUN: 11 mg/dL (ref 6–24)
Bilirubin Total: 0.4 mg/dL (ref 0.0–1.2)
CO2: 24 mmol/L (ref 20–29)
Calcium: 9.3 mg/dL (ref 8.7–10.2)
Chloride: 104 mmol/L (ref 96–106)
Creatinine, Ser: 1.06 mg/dL (ref 0.76–1.27)
GFR calc Af Amer: 93 mL/min/{1.73_m2} (ref >59)
GFR calc non Af Amer: 80 mL/min/{1.73_m2} (ref >59)
Globulin, Total: 2.5 g/dL (ref 1.5–4.5)
Glucose: 88 mg/dL (ref 65–99)
Potassium: 4.5 mmol/L (ref 3.5–5.2)
Sodium: 141 mmol/L (ref 134–144)
Total Protein: 6.7 g/dL (ref 6.0–8.5)

## 2021-01-06 LAB — CBC WITH DIFFERENTIAL/PLATELET
Basophils Absolute: 0.1 10*3/uL (ref 0.0–0.2)
Basos: 1 %
EOS (ABSOLUTE): 0.3 10*3/uL (ref 0.0–0.4)
Eos: 3 %
Hematocrit: 42.2 % (ref 37.5–51.0)
Hemoglobin: 14.1 g/dL (ref 13.0–17.7)
Immature Grans (Abs): 0.1 10*3/uL (ref 0.0–0.1)
Immature Granulocytes: 1 %
Lymphocytes Absolute: 2.2 10*3/uL (ref 0.7–3.1)
Lymphs: 26 %
MCH: 30.2 pg (ref 26.6–33.0)
MCHC: 33.4 g/dL (ref 31.5–35.7)
MCV: 90 fL (ref 79–97)
Monocytes Absolute: 0.8 10*3/uL (ref 0.1–0.9)
Monocytes: 9 %
Neutrophils Absolute: 5.2 10*3/uL (ref 1.4–7.0)
Neutrophils: 60 %
Platelets: 204 10*3/uL (ref 150–450)
RBC: 4.67 x10E6/uL (ref 4.14–5.80)
RDW: 13.1 % (ref 11.6–15.4)
WBC: 8.6 10*3/uL (ref 3.4–10.8)

## 2021-01-06 LAB — BRAIN NATRIURETIC PEPTIDE: BNP: 15 pg/mL (ref 0.0–100.0)

## 2021-01-06 LAB — TSH: TSH: 1.26 u[IU]/mL (ref 0.450–4.500)

## 2021-01-10 ENCOUNTER — Ambulatory Visit
Admission: RE | Admit: 2021-01-10 | Discharge: 2021-01-10 | Disposition: A | Discharge status: Home / Self Care | Payer: Commercial Managed Care - PPO | Source: Ambulatory Visit | Attending: Physician Assistant | Admitting: Physician Assistant

## 2021-01-10 ENCOUNTER — Other Ambulatory Visit: Payer: Self-pay

## 2021-01-10 DIAGNOSIS — R0600 Dyspnea, unspecified: Secondary | ICD-10-CM

## 2021-01-10 DIAGNOSIS — R0609 Other forms of dyspnea: Secondary | ICD-10-CM

## 2021-04-05 ENCOUNTER — Other Ambulatory Visit: Payer: Self-pay

## 2021-04-05 ENCOUNTER — Encounter: Payer: Self-pay | Admitting: Physician Assistant

## 2021-04-05 ENCOUNTER — Ambulatory Visit: Payer: Commercial Managed Care - PPO | Admitting: Physician Assistant

## 2021-04-05 VITALS — BP 135/82 | HR 74 | Temp 97.8°F | Ht 68.0 in | Wt 258.9 lb

## 2021-04-05 DIAGNOSIS — F411 Generalized anxiety disorder: Secondary | ICD-10-CM

## 2021-04-05 DIAGNOSIS — K600 Acute anal fissure: Secondary | ICD-10-CM | POA: Insufficient documentation

## 2021-04-05 DIAGNOSIS — M5441 Lumbago with sciatica, right side: Secondary | ICD-10-CM | POA: Diagnosis not present

## 2021-04-05 DIAGNOSIS — E781 Pure hyperglyceridemia: Secondary | ICD-10-CM | POA: Diagnosis not present

## 2021-04-05 DIAGNOSIS — R0609 Other forms of dyspnea: Secondary | ICD-10-CM

## 2021-04-05 DIAGNOSIS — E785 Hyperlipidemia, unspecified: Secondary | ICD-10-CM | POA: Diagnosis not present

## 2021-04-05 DIAGNOSIS — G4700 Insomnia, unspecified: Secondary | ICD-10-CM

## 2021-04-05 DIAGNOSIS — R0600 Dyspnea, unspecified: Secondary | ICD-10-CM

## 2021-04-05 DIAGNOSIS — K645 Perianal venous thrombosis: Secondary | ICD-10-CM | POA: Insufficient documentation

## 2021-04-05 MED ORDER — VENLAFAXINE HCL ER 75 MG PO CP24: 75.0000 mg | ORAL_CAPSULE | Freq: Every day | ORAL | 2 refills | Status: DC

## 2021-04-05 MED ORDER — METHOCARBAMOL 500 MG PO TABS
500.0000 mg | ORAL_TABLET | Freq: Three times a day (TID) | ORAL | 0 refills | Status: DC | PRN
Start: 2021-04-05 — End: 2022-07-03

## 2021-04-05 MED ORDER — HYDROXYZINE HCL 10 MG PO TABS: 10.0000 mg | ORAL_TABLET | Freq: Three times a day (TID) | ORAL | 0 refills | Status: DC | PRN

## 2021-04-05 MED ORDER — METHYLPREDNISOLONE SODIUM SUCC 125 MG IJ SOLR
125.0000 mg | Freq: Once | INTRAMUSCULAR | Status: AC
  Administered 2021-04-05: 125 mg via INTRAMUSCULAR

## 2021-04-05 MED ORDER — ATORVASTATIN CALCIUM 40 MG PO TABS
40.0000 mg | ORAL_TABLET | Freq: Every day | ORAL | 1 refills | Status: DC
Start: 2021-04-05 — End: 2021-06-29

## 2021-04-05 MED ORDER — SODIUM CHLORIDE 0.9 % IV SOLN: 125.0000 mg | Freq: Once | INTRAVENOUS | Status: AC

## 2021-04-05 NOTE — Patient Instructions (Signed)

## 2021-04-05 NOTE — Addendum Note (Signed)
Addended by: Mickel Crow on: 04/05/2021 10:26 AM   Modules accepted: Orders

## 2021-04-05 NOTE — Addendum Note (Signed)
Addended by: Mickel Crow on: 04/05/2021 10:23 AM   Modules accepted: Orders

## 2021-04-05 NOTE — Progress Notes (Signed)
Established Patient Office Visit  Subjective:  Patient ID: Garrett Holmes, male    DOB: January 07, 1966  Age: 55 y.o. MRN: [id number]  CC:  Chief Complaint  Patient presents with  . Depression  . Back Pain    HPI Garrett Holmes presents for follow up on mood management, insomnia and hyperlipidemia. Patient has c/o low back pain that started last Thursday (6 days ago).  States pain radiates to right lower leg.  Denies injury or strenuous activity, fever, bladder or bowel dysfunction.  Has tried 400-800 mg of ibuprofen with minimal relief.  Reports pain is constant and feels muscle tightness.  Sitting makes the pain worse.  HLD: Pt taking medication as directed without issues.  Patient reports has not been able to do any form of physical activity due to work. Continues to complain of shortness of breath with exertion. No new symptoms.  Mood: Venlafaxine was increased to 75 mg last office visit.  Patient reports anxiety is a little better but continues to have challenging days.  Reports has been overwhelmed with work and has even considered quitting his job.  Denies SI/HI.  Insomnia: Reports sleep is good if he takes a sleeping aid (started a new one and unable to recall med name).  Past Medical History:  Diagnosis Date  . Allergy   . Anxiety   . Depression   . Dyslipidemia   . Hyperlipidemia     Past Surgical History:  Procedure Laterality Date  . COLONOSCOPY  2012   [facility]G    Family History  Problem Relation Age of Onset  . Cancer Mother 60       Renal cancer    Social History   Socioeconomic History  . Marital status: Married    Spouse name: Not on file  . Number of children: Not on file  . Years of education: Not on file  . Highest education level: Not on file  Occupational History  . Not on file  Tobacco Use  . Smoking status: Former Smoker    Types: Cigarettes    Quit date: 12/17/2010    Years since quitting: 10.3  . Smokeless tobacco: Never Used  Vaping Use  .  Vaping Use: Never used  Substance and Sexual Activity  . Alcohol use: Yes    Alcohol/week: 1.0 standard drink    Types: 1 Cans of beer per week  . Drug use: Not Currently    Types: Morphine  . Sexual activity: Yes  Other Topics Concern  . Not on file  Social History Narrative  . Not on file   Social Determinants of Health   Financial Resource Strain: Not on file  Food Insecurity: Not on file  Transportation Needs: Not on file  Physical Activity: Not on file  Stress: Not on file  Social Connections: Not on file  Intimate Partner Violence: Not on file    Outpatient Medications Prior to Visit  Medication Sig Dispense Refill  . ALPRAZolam (XANAX) 0.25 MG tablet Take 1 tablet (0.25 mg total) by mouth 2 (two) times daily as needed for anxiety. 20 tablet 0  . linaclotide (LINZESS) 145 MCG CAPS capsule Take 1 capsule (145 mcg total) by mouth daily. PRN per patient 30 capsule 2  . atorvastatin (LIPITOR) 40 MG tablet Take 1 tablet (40 mg total) by mouth daily. 90 tablet 1  . venlafaxine XR (EFFEXOR XR) 75 MG 24 hr capsule Take 1 capsule (75 mg total) by mouth daily with breakfast. 30 capsule 2  No facility-administered medications prior to visit.    Allergies  Allergen Reactions  . Sudafed [Pseudoephedrine Hcl]     Wires him up, agitation    ROS Review of Systems A fourteen system review of systems was performed and found to be positive as per HPI.   Objective:    Physical Exam General:  Well Developed, well nourished, appropriate for stated age.  Neuro:  Alert and oriented,  extra-ocular muscles intact  HEENT:  Normocephalic, atraumatic, neck supple Skin:  no gross rash, warm, pink. Cardiac:  RRR, S1 S2 Respiratory:  ECTA B/L and A/P, Not using accessory muscles, speaking in full sentences- unlabored. MSK: No step off or bony abnormality noted, tenderness to lumbar paraspinal muscles and right lower extremity, limited ROM with spinal flexion, good side-bending ROM.   Vascular:  Ext warm, no cyanosis apprec.; cap RF less 2 sec. Psych:  No HI/SI, judgement and insight good, Euthymic mood. Full Affect.   BP 135/82   Pulse 74   Temp 97.8 F (36.6 C)   Ht 5\' 8"  (1.727 m)   Wt 258 lb 14.4 oz (117.4 kg)   SpO2 96%   BMI 39.37 kg/m  Wt Readings from Last 3 Encounters:  04/05/21 258 lb 14.4 oz (117.4 kg)  01/05/21 262 lb 1.6 oz (118.9 kg)  09/29/20 251 lb 1.6 oz (113.9 kg)     Health Maintenance Due  Topic Date Due  . COVID-19 Vaccine (1) Never done    There are no preventive care reminders to display for this patient.  Lab Results  Component Value Date   TSH 1.260 01/05/2021   Lab Results  Component Value Date   WBC 8.6 01/05/2021   HGB 14.1 01/05/2021   HCT 42.2 01/05/2021   MCV 90 01/05/2021   PLT 204 01/05/2021   Lab Results  Component Value Date   NA 141 01/05/2021   K 4.5 01/05/2021   CO2 24 01/05/2021   GLUCOSE 88 01/05/2021   BUN 11 01/05/2021   CREATININE 1.06 01/05/2021   BILITOT 0.4 01/05/2021   ALKPHOS 82 01/05/2021   AST 27 01/05/2021   ALT 37 01/05/2021   PROT 6.7 01/05/2021   ALBUMIN 4.2 01/05/2021   CALCIUM 9.3 01/05/2021   Lab Results  Component Value Date   CHOL 184 06/29/2020   Lab Results  Component Value Date   HDL 46 06/29/2020   Lab Results  Component Value Date   LDLCALC 99 06/29/2020   Lab Results  Component Value Date   TRIG 230 (H) 06/29/2020   Lab Results  Component Value Date   CHOLHDL 4.0 06/29/2020   Lab Results  Component Value Date   HGBA1C 5.6 06/29/2020      Assessment & Plan:   Problem List Items Addressed This Visit      Other   GAD (generalized anxiety disorder) (Chronic)   Relevant Medications   hydrOXYzine (ATARAX/VISTARIL) 10 MG tablet   venlafaxine XR (EFFEXOR XR) 75 MG 24 hr capsule   Hypertriglyceridemia >er 500 (Chronic)   Relevant Medications   atorvastatin (LIPITOR) 40 MG tablet   Other Relevant Orders   ECHOCARDIOGRAM STRESS TEST   Cardiac  Stress Test: Informed Consent Details: Physician/Practitioner Attestation; Transcribe to consent form and obtain patient signature   Hyperlipidemia - Primary   Relevant Medications   atorvastatin (LIPITOR) 40 MG tablet   Other Relevant Orders   ECHOCARDIOGRAM STRESS TEST   Cardiac Stress Test: Informed Consent Details: Physician/Practitioner Attestation; Transcribe to consent form and  obtain patient signature    Other Visit Diagnoses    Insomnia, unspecified type       Dyspnea on exertion       Relevant Orders   ECHOCARDIOGRAM STRESS TEST   Cardiac Stress Test: Informed Consent Details: Physician/Practitioner Attestation; Transcribe to consent form and obtain patient signature   Acute bilateral low back pain with right-sided sciatica       Relevant Medications   methocarbamol (ROBAXIN) 500 MG tablet   hydrOXYzine (ATARAX/VISTARIL) 10 MG tablet   venlafaxine XR (EFFEXOR XR) 75 MG 24 hr capsule     Acute bilateral low back pain with right-sided sciatica: -Patient has no red flag signs/symptoms for cauda equina. Will administer corticosteroid injection and advised to take muscle relaxer as needed, cautioned on potential side effects including drowsiness.  -Recommend heat therapy and gentle stretches and exercises.  Dyspnea on exertion: -Will place order for stress test for further evaluation. 01/10/21 Chest XR and labs obtained last visit were unremarkable.  Hyperlipidemia, Hypertriglyceridemia: -Stable. -Continue current medication regimen.  -Will repeat lipid panel and hepatic at follow up visit.  GAD: -PHQ-9 score of 4, mildly increased from last visit. -Continue current medication regimen. Discussed with patient prn medication for severe anxiety and is agreeable to trial hydroxyzine.  -Will continue to monitor.  Insomnia: -Stable. -Continue sleep aid and good sleep hygiene discussed. -Will continue to monitor.      Meds ordered this encounter  Medications  .  methocarbamol (ROBAXIN) 500 MG tablet    Sig: Take 1 tablet (500 mg total) by mouth every 8 (eight) hours as needed for muscle spasms.    Dispense:  30 tablet    Refill:  0    Order Specific Question:   Supervising Provider    Answer:   Beatrice Lecher D [[id number]]  . hydrOXYzine (ATARAX/VISTARIL) 10 MG tablet    Sig: Take 1 tablet (10 mg total) by mouth every 8 (eight) hours as needed for anxiety.    Dispense:  30 tablet    Refill:  0    Order Specific Question:   Supervising Provider    Answer:   Beatrice Lecher D [[id number]]  . atorvastatin (LIPITOR) 40 MG tablet    Sig: Take 1 tablet (40 mg total) by mouth daily.    Dispense:  90 tablet    Refill:  1    Order Specific Question:   Supervising Provider    Answer:   Beatrice Lecher D [[id number]]  . venlafaxine XR (EFFEXOR XR) 75 MG 24 hr capsule    Sig: Take 1 capsule (75 mg total) by mouth daily with breakfast.    Dispense:  30 capsule    Refill:  2    Order Specific Question:   Supervising Provider    Answer:   Beatrice Lecher D [[id number]]    Follow-up: Return in about 3 months (around 07/05/2021) for Mood, HLD, insomnia .    Lorrene Reid, PA-C

## 2021-04-27 ENCOUNTER — Telehealth (HOSPITAL_COMMUNITY): Payer: Self-pay | Admitting: *Deleted

## 2021-04-27 NOTE — Telephone Encounter (Signed)
Instructions given to patient regarding stress Echo appointment.  Garrett Holmes

## 2021-05-02 ENCOUNTER — Other Ambulatory Visit (HOSPITAL_COMMUNITY): Payer: Commercial Managed Care - PPO

## 2021-05-03 ENCOUNTER — Ambulatory Visit (HOSPITAL_BASED_OUTPATIENT_CLINIC_OR_DEPARTMENT_OTHER): Payer: Commercial Managed Care - PPO

## 2021-05-03 ENCOUNTER — Ambulatory Visit (HOSPITAL_COMMUNITY): Payer: Commercial Managed Care - PPO | Attending: Cardiovascular Disease

## 2021-05-03 ENCOUNTER — Other Ambulatory Visit: Payer: Self-pay

## 2021-05-03 DIAGNOSIS — R0609 Other forms of dyspnea: Secondary | ICD-10-CM

## 2021-05-03 DIAGNOSIS — E785 Hyperlipidemia, unspecified: Secondary | ICD-10-CM | POA: Diagnosis not present

## 2021-05-03 DIAGNOSIS — E781 Pure hyperglyceridemia: Secondary | ICD-10-CM

## 2021-05-03 DIAGNOSIS — R0600 Dyspnea, unspecified: Secondary | ICD-10-CM | POA: Diagnosis not present

## 2021-05-03 LAB — ECHOCARDIOGRAM STRESS TEST
AR max vel: 2.59 cm2
AV Area VTI: 3.37 cm2
AV Area mean vel: 3.09 cm2
AV Mean grad: 7 mmHg
AV Peak grad: 16.8 mmHg
Ao pk vel: 2.05 m/s
Area-P 1/2: 2.8 cm2
S' Lateral: 3.1 cm

## 2021-05-03 MED ORDER — PERFLUTREN LIPID MICROSPHERE
1.0000 mL | INTRAVENOUS | Status: AC | PRN
  Administered 2021-05-03 (×3): 2 mL via INTRAVENOUS

## 2021-05-16 ENCOUNTER — Ambulatory Visit: Payer: Commercial Managed Care - PPO | Admitting: Physician Assistant

## 2021-05-16 ENCOUNTER — Encounter: Payer: Self-pay | Admitting: Physician Assistant

## 2021-05-16 DIAGNOSIS — F411 Generalized anxiety disorder: Secondary | ICD-10-CM

## 2021-06-13 ENCOUNTER — Ambulatory Visit: Payer: Commercial Managed Care - PPO | Admitting: Psychology

## 2021-06-27 ENCOUNTER — Ambulatory Visit: Payer: Commercial Managed Care - PPO | Admitting: Psychology

## 2021-06-28 ENCOUNTER — Ambulatory Visit: Payer: Commercial Managed Care - PPO | Admitting: Physician Assistant

## 2021-06-28 ENCOUNTER — Encounter: Payer: Self-pay | Admitting: Physician Assistant

## 2021-06-28 ENCOUNTER — Other Ambulatory Visit: Payer: Self-pay

## 2021-06-28 VITALS — BP 142/88 | HR 62 | Temp 97.4°F | Ht 68.0 in | Wt 261.6 lb

## 2021-06-28 DIAGNOSIS — E669 Obesity, unspecified: Secondary | ICD-10-CM

## 2021-06-28 DIAGNOSIS — R030 Elevated blood-pressure reading, without diagnosis of hypertension: Secondary | ICD-10-CM

## 2021-06-28 DIAGNOSIS — E785 Hyperlipidemia, unspecified: Secondary | ICD-10-CM | POA: Diagnosis not present

## 2021-06-28 DIAGNOSIS — F411 Generalized anxiety disorder: Secondary | ICD-10-CM

## 2021-06-28 DIAGNOSIS — G4700 Insomnia, unspecified: Secondary | ICD-10-CM

## 2021-06-28 NOTE — Assessment & Plan Note (Signed)
-  BP initially elevated, BP recheck mildly improved. Patient asymptomatic. -Will repeat blood pressure with lab visit.

## 2021-06-28 NOTE — Assessment & Plan Note (Signed)
-  GAD-7 score of 3, PHQ-9 score of 2, both have improved from prior.  -Continue current medication regimen. -Encourage to incorporate mindfulness therapy or physical activity such as 5-15 minutes of walking. -Will continue to monitor.

## 2021-06-28 NOTE — Patient Instructions (Signed)

## 2021-06-28 NOTE — Assessment & Plan Note (Signed)
-  Associated with hyperlipidemia and hypertriglyceridemia. -Discussed with patient management options including medications and potential side effects. Advise to start with dietary changes such as following a heart healthy diet. Patient will contact insurance and inquire coverage for weight loss medication and let me know if interested.

## 2021-06-28 NOTE — Progress Notes (Signed)
Established Patient Office Visit  Subjective:  Patient ID: Garrett Holmes, male    DOB: 1966/06/29  Age: 55 y.o. MRN: [id number]  CC:  Chief Complaint  Patient presents with   Depression   Insomnia    HPI RODELL MARRS presents for follow up on mood, insomnia and hyperlipidemia.  Mood: Patient reports decided not to pursue psychology referral and canceled appointment. States went a couple of days without his medication and once he restarted taking it consistently noticed his anxiety was better.  Has taken a few doses of hydroxyzine. Feels like mood is stable, denies SI/HI.  Insomnia: Reports sleep is about the same. Has trouble with staying asleep which has been a chronic problem but usually is able to fall back asleep. Does limit caffeine intake after lunch time, has decaf tea or water for dinner.  HLD: Pt taking medication as directed without issues. Denies side effects including myalgias, muscle weakness and RUQ pain. States has no time for exercise given he stays so busy with work. Eating habits are not always the best due to eating out. Is interested on weight loss.    Past Medical History:  Diagnosis Date   Allergy    Anxiety    Depression    Dyslipidemia    Hyperlipidemia     Past Surgical History:  Procedure Laterality Date   COLONOSCOPY  2012   HUNG    Family History  Problem Relation Age of Onset   Cancer Mother 54       Renal cancer    Social History   Socioeconomic History   Marital status: Married    Spouse name: Not on file   Number of children: Not on file   Years of education: Not on file   Highest education level: Not on file  Occupational History   Not on file  Tobacco Use   Smoking status: Former    Pack years: 0.00    Types: Cigarettes    Quit date: 12/17/2010    Years since quitting: 10.5   Smokeless tobacco: Never  Vaping Use   Vaping Use: Never used  Substance and Sexual Activity   Alcohol use: Yes    Alcohol/week: 1.0 standard  drink    Types: 1 Cans of beer per week   Drug use: Not Currently    Types: Morphine   Sexual activity: Yes  Other Topics Concern   Not on file  Social History Narrative   Not on file   Social Determinants of Health   Financial Resource Strain: Not on file  Food Insecurity: Not on file  Transportation Needs: Not on file  Physical Activity: Not on file  Stress: Not on file  Social Connections: Not on file  Intimate Partner Violence: Not on file    Outpatient Medications Prior to Visit  Medication Sig Dispense Refill   ALPRAZolam (XANAX) 0.25 MG tablet Take 1 tablet (0.25 mg total) by mouth 2 (two) times daily as needed for anxiety. 20 tablet 0   atorvastatin (LIPITOR) 40 MG tablet Take 1 tablet (40 mg total) by mouth daily. 90 tablet 1   hydrOXYzine (ATARAX/VISTARIL) 10 MG tablet Take 1 tablet (10 mg total) by mouth every 8 (eight) hours as needed for anxiety. 30 tablet 0   linaclotide (LINZESS) 145 MCG CAPS capsule Take 1 capsule (145 mcg total) by mouth daily. PRN per patient 30 capsule 2   methocarbamol (ROBAXIN) 500 MG tablet Take 1 tablet (500 mg total) by mouth every  8 (eight) hours as needed for muscle spasms. 30 tablet 0   venlafaxine XR (EFFEXOR XR) 75 MG 24 hr capsule Take 1 capsule (75 mg total) by mouth daily with breakfast. 30 capsule 2   Facility-Administered Medications Prior to Visit  Medication Dose Route Frequency Provider Last Rate Last Admin   methylPREDNISolone sodium succinate (SOLU-MEDROL) 130 mg in sodium chloride 0.9 % 50 mL IVPB  130 mg Intravenous Once Shamina Etheridge, PA-C        Allergies  Allergen Reactions   Sudafed [Pseudoephedrine Hcl]     Wires him up, agitation    ROS Review of Systems Review of Systems:  A fourteen system review of systems was performed and found to be positive as per HPI.   Objective:    Physical Exam General:  Well Developed, well nourished, in no acute distress  Neuro:  Alert and oriented,  extra-ocular muscles  intact  HEENT:  Normocephalic, atraumatic, neck supple Skin:  no gross rash, warm, pink. Cardiac:  RRR Respiratory:  ECTA B/L, Not using accessory muscles, speaking in full sentences- unlabored. Vascular:  Ext warm, no cyanosis apprec.; cap RF less 2 sec. Psych:  No HI/SI, judgement and insight good, Euthymic mood. Full Affect.  BP (!) 142/88   Pulse 62   Temp (!) 97.4 F (36.3 C)   Ht 5\' 8"  (1.727 m)   Wt 261 lb 9.6 oz (118.7 kg)   SpO2 99%   BMI 39.78 kg/m  Wt Readings from Last 3 Encounters:  06/28/21 261 lb 9.6 oz (118.7 kg)  04/05/21 258 lb 14.4 oz (117.4 kg)  01/05/21 262 lb 1.6 oz (118.9 kg)     Health Maintenance Due  Topic Date Due   COVID-19 Vaccine (1) Never done   Pneumococcal Vaccine 61-15 Years old (1 - PCV) Never done   Zoster Vaccines- Shingrix (1 of 2) Never done    There are no preventive care reminders to display for this patient.  Lab Results  Component Value Date   TSH 1.260 01/05/2021   Lab Results  Component Value Date   WBC 8.6 01/05/2021   HGB 14.1 01/05/2021   HCT 42.2 01/05/2021   MCV 90 01/05/2021   PLT 204 01/05/2021   Lab Results  Component Value Date   NA 141 01/05/2021   K 4.5 01/05/2021   CO2 24 01/05/2021   GLUCOSE 88 01/05/2021   BUN 11 01/05/2021   CREATININE 1.06 01/05/2021   BILITOT 0.4 01/05/2021   ALKPHOS 82 01/05/2021   AST 27 01/05/2021   ALT 37 01/05/2021   PROT 6.7 01/05/2021   ALBUMIN 4.2 01/05/2021   CALCIUM 9.3 01/05/2021   Lab Results  Component Value Date   CHOL 184 06/29/2020   Lab Results  Component Value Date   HDL 46 06/29/2020   Lab Results  Component Value Date   LDLCALC 99 06/29/2020   Lab Results  Component Value Date   TRIG 230 (H) 06/29/2020   Lab Results  Component Value Date   CHOLHDL 4.0 06/29/2020   Lab Results  Component Value Date   HGBA1C 5.6 06/29/2020      Assessment & Plan:   Problem List Items Addressed This Visit       Other   Obesity (BMI 30-39.9)  (Chronic)    -Associated with hyperlipidemia and hypertriglyceridemia. -Discussed with patient management options including medications and potential side effects. Advise to start with dietary changes such as following a heart healthy diet. Patient will contact insurance  and inquire coverage for weight loss medication and let me know if interested.        GAD (generalized anxiety disorder) - Primary (Chronic)    -GAD-7 score of 3, PHQ-9 score of 2, both have improved from prior.  -Continue current medication regimen. -Encourage to incorporate mindfulness therapy or physical activity such as 5-15 minutes of walking. -Will continue to monitor.        Hyperlipidemia    -Last lipid panel: Total cholesterol 184, triglycerides 230, HDL 46, LDL 99 -Advised patient to schedule lab visit to repeat fasting lipid panel. -Continue current medication regimen. -Recommend to follow a low-fat diet. -Will continue to monitor.       Elevated blood pressure reading    -BP initially elevated, BP recheck mildly improved. Patient asymptomatic. -Will repeat blood pressure with lab visit.       Other Visit Diagnoses     Insomnia, unspecified type          Insomnia: -Stable, no change.  -Will continue to monitor.  No orders of the defined types were placed in this encounter.   Follow-up: Return in about 4 months (around 10/29/2021) for mood, insomnia, elevated BP; lab visit for FBW in 1-4 weeks.   Note:  This note was prepared with assistance of Dragon voice recognition software. Occasional wrong-word or sound-a-like substitutions may have occurred due to the inherent limitations of voice recognition software.  Lorrene Reid, PA-C

## 2021-06-28 NOTE — Assessment & Plan Note (Signed)
-  Last lipid panel: Total cholesterol 184, triglycerides 230, HDL 46, LDL 99 -Advised patient to schedule lab visit to repeat fasting lipid panel. -Continue current medication regimen. -Recommend to follow a low-fat diet. -Will continue to monitor.

## 2021-06-28 NOTE — Assessment & Plan Note (Signed)
>>  ASSESSMENT AND PLAN FOR ELEVATED BLOOD PRESSURE READING WRITTEN ON 06/28/2021 12:56 PM BY ABONZA, MARITZA, PA-C  -BP initially elevated, BP recheck mildly improved. Patient asymptomatic. -Will repeat blood pressure with lab visit.

## 2021-06-29 ENCOUNTER — Other Ambulatory Visit: Payer: Self-pay | Admitting: Physician Assistant

## 2021-06-29 DIAGNOSIS — F411 Generalized anxiety disorder: Secondary | ICD-10-CM

## 2021-06-29 DIAGNOSIS — E785 Hyperlipidemia, unspecified: Secondary | ICD-10-CM

## 2021-06-29 DIAGNOSIS — E781 Pure hyperglyceridemia: Secondary | ICD-10-CM

## 2021-07-11 ENCOUNTER — Other Ambulatory Visit: Payer: Commercial Managed Care - PPO

## 2021-07-11 ENCOUNTER — Other Ambulatory Visit: Payer: Self-pay

## 2021-07-11 DIAGNOSIS — R7303 Prediabetes: Secondary | ICD-10-CM

## 2021-07-11 DIAGNOSIS — Z1329 Encounter for screening for other suspected endocrine disorder: Secondary | ICD-10-CM

## 2021-07-11 DIAGNOSIS — E785 Hyperlipidemia, unspecified: Secondary | ICD-10-CM

## 2021-07-11 DIAGNOSIS — Z136 Encounter for screening for cardiovascular disorders: Secondary | ICD-10-CM

## 2021-07-12 LAB — COMPREHENSIVE METABOLIC PANEL
ALT: 42 IU/L (ref 0–44)
AST: 25 IU/L (ref 0–40)
Albumin/Globulin Ratio: 1.9 (ref 1.2–2.2)
Albumin: 4.3 g/dL (ref 3.8–4.9)
Alkaline Phosphatase: 82 IU/L (ref 44–121)
BUN/Creatinine Ratio: 12 (ref 9–20)
BUN: 12 mg/dL (ref 6–24)
Bilirubin Total: 0.4 mg/dL (ref 0.0–1.2)
CO2: 22 mmol/L (ref 20–29)
Calcium: 9.2 mg/dL (ref 8.7–10.2)
Chloride: 102 mmol/L (ref 96–106)
Creatinine, Ser: 0.97 mg/dL (ref 0.76–1.27)
Globulin, Total: 2.3 g/dL (ref 1.5–4.5)
Glucose: 87 mg/dL (ref 65–99)
Potassium: 4.3 mmol/L (ref 3.5–5.2)
Sodium: 140 mmol/L (ref 134–144)
Total Protein: 6.6 g/dL (ref 6.0–8.5)
eGFR: 94 mL/min/{1.73_m2} (ref >59)

## 2021-07-12 LAB — LIPID PANEL
Chol/HDL Ratio: 4.8 ratio (ref 0.0–5.0)
Cholesterol, Total: 184 mg/dL (ref 100–199)
HDL: 38 mg/dL — ABNORMAL LOW (ref >39)
LDL Chol Calc (NIH): 83 mg/dL (ref 0–99)
Triglycerides: 390 mg/dL — ABNORMAL HIGH (ref 0–149)
VLDL Cholesterol Cal: 63 mg/dL — ABNORMAL HIGH (ref 5–40)

## 2021-07-12 LAB — CBC WITH DIFFERENTIAL/PLATELET
Basophils Absolute: 0.1 10*3/uL (ref 0.0–0.2)
Basos: 1 %
EOS (ABSOLUTE): 0.2 10*3/uL (ref 0.0–0.4)
Eos: 2 %
Hematocrit: 41 % (ref 37.5–51.0)
Hemoglobin: 13.8 g/dL (ref 13.0–17.7)
Immature Grans (Abs): 0.1 10*3/uL (ref 0.0–0.1)
Immature Granulocytes: 1 %
Lymphocytes Absolute: 1.7 10*3/uL (ref 0.7–3.1)
Lymphs: 19 %
MCH: 30.3 pg (ref 26.6–33.0)
MCHC: 33.7 g/dL (ref 31.5–35.7)
MCV: 90 fL (ref 79–97)
Monocytes Absolute: 1 10*3/uL — ABNORMAL HIGH (ref 0.1–0.9)
Monocytes: 11 %
Neutrophils Absolute: 5.9 10*3/uL (ref 1.4–7.0)
Neutrophils: 66 %
Platelets: 194 10*3/uL (ref 150–450)
RBC: 4.55 x10E6/uL (ref 4.14–5.80)
RDW: 12.8 % (ref 11.6–15.4)
WBC: 8.8 10*3/uL (ref 3.4–10.8)

## 2021-07-12 LAB — HEMOGLOBIN A1C
Est. average glucose Bld gHb Est-mCnc: 117 mg/dL
Hgb A1c MFr Bld: 5.7 % — ABNORMAL HIGH (ref 4.8–5.6)

## 2021-07-12 LAB — TSH: TSH: 1.22 u[IU]/mL (ref 0.450–4.500)

## 2021-08-03 ENCOUNTER — Telehealth: Payer: Self-pay | Admitting: Physician Assistant

## 2021-08-03 NOTE — Telephone Encounter (Signed)
Patient has a possible ear or sinus infection and can feel fluid in his right ear. Patient has taken a negative covid test.

## 2021-08-03 NOTE — Telephone Encounter (Signed)
Patient scheduled for in office appointment tomorrow. AS, CMA

## 2021-08-04 ENCOUNTER — Encounter: Payer: Self-pay | Admitting: Nurse Practitioner

## 2021-08-04 ENCOUNTER — Ambulatory Visit (INDEPENDENT_AMBULATORY_CARE_PROVIDER_SITE_OTHER): Payer: Commercial Managed Care - PPO | Admitting: Nurse Practitioner

## 2021-08-04 ENCOUNTER — Other Ambulatory Visit: Payer: Self-pay

## 2021-08-04 VITALS — BP 139/85 | HR 70 | Temp 97.5°F | Ht 68.0 in | Wt 259.4 lb

## 2021-08-04 DIAGNOSIS — J3089 Other allergic rhinitis: Secondary | ICD-10-CM

## 2021-08-04 MED ORDER — FLUTICASONE PROPIONATE 50 MCG/ACT NA SUSP: 2.0000 | Freq: Every day | NASAL | 2 refills | Status: DC

## 2021-08-04 NOTE — Progress Notes (Signed)
Acute Office Visit  Subjective:    Patient ID: Garrett Holmes, male    DOB: 15-Jun-1966, 55 y.o.   MRN: [id number]  Chief Complaint  Patient presents with   Ear Fullness    HPI Patient is in today for evaluation of right ear congestion.  States right ear feels full.  States he has been taking over-the-counter medication for sinus congestion for about 10 days.  States he is really gotten no relief.  Home COVID test was negative.  States he is ready to go on a trip in a few weeks.  Has to fly.  Wanted to make sure there is no problems with ear or eardrum prior to departure.  Patient denies fever, headaches, chills, or body pain.  Denies nausea and vomiting.  States that some days are worse than others.  He denies sore throat.  Past Medical History:  Diagnosis Date   Allergy    Anxiety    Depression    Dyslipidemia    Hyperlipidemia     Past Surgical History:  Procedure Laterality Date   COLONOSCOPY  2012   HUNG    Family History  Problem Relation Age of Onset   Cancer Mother 23       Renal cancer    Social History   Socioeconomic History   Marital status: Married    Spouse name: Not on file   Number of children: Not on file   Years of education: Not on file   Highest education level: Not on file  Occupational History   Not on file  Tobacco Use   Smoking status: Former    Types: Cigarettes    Quit date: 12/17/2010    Years since quitting: 10.6   Smokeless tobacco: Never  Vaping Use   Vaping Use: Never used  Substance and Sexual Activity   Alcohol use: Yes    Alcohol/week: 1.0 standard drink    Types: 1 Cans of beer per week   Drug use: Not Currently    Types: Morphine   Sexual activity: Yes  Other Topics Concern   Not on file  Social History Narrative   Not on file   Social Determinants of Health   Financial Resource Strain: Not on file  Food Insecurity: Not on file  Transportation Needs: Not on file  Physical Activity: Not on file  Stress: Not on  file  Social Connections: Not on file  Intimate Partner Violence: Not on file    Outpatient Medications Prior to Visit  Medication Sig Dispense Refill   ALPRAZolam (XANAX) 0.25 MG tablet Take 1 tablet (0.25 mg total) by mouth 2 (two) times daily as needed for anxiety. 20 tablet 0   atorvastatin (LIPITOR) 40 MG tablet Take 1 tablet by mouth once daily 30 tablet 0   hydrOXYzine (ATARAX/VISTARIL) 10 MG tablet TAKE 1 TABLET BY MOUTH EVERY 8 HOURS AS NEEDED FOR ANXIETY 30 tablet 0   linaclotide (LINZESS) 145 MCG CAPS capsule Take 1 capsule (145 mcg total) by mouth daily. PRN per patient 30 capsule 2   methocarbamol (ROBAXIN) 500 MG tablet Take 1 tablet (500 mg total) by mouth every 8 (eight) hours as needed for muscle spasms. 30 tablet 0   venlafaxine XR (EFFEXOR XR) 75 MG 24 hr capsule Take 1 capsule (75 mg total) by mouth daily with breakfast. 30 capsule 2   Facility-Administered Medications Prior to Visit  Medication Dose Route Frequency Provider Last Rate Last Admin   methylPREDNISolone sodium succinate (SOLU-MEDROL) 130  mg in sodium chloride 0.9 % 50 mL IVPB  130 mg Intravenous Once Abonza, Maritza, PA-C        Allergies  Allergen Reactions   Sudafed [Pseudoephedrine Hcl]     Wires him up, agitation    Review of Systems  Constitutional:  Negative for activity change, appetite change, chills, fatigue and fever.  HENT:  Positive for ear pain. Negative for congestion, postnasal drip, rhinorrhea, sinus pressure, sinus pain, sore throat and voice change.   Eyes: Negative.   Respiratory:  Negative for cough, chest tightness, shortness of breath and wheezing.   Cardiovascular:  Negative for chest pain and palpitations.  Gastrointestinal:  Negative for constipation, diarrhea, nausea and vomiting.  Endocrine: Negative.   Musculoskeletal:  Negative for arthralgias, back pain and myalgias.  Skin:  Negative for rash.  Allergic/Immunologic: Negative for environmental allergies.  Neurological:   Negative for dizziness, weakness and headaches.  Hematological:  Negative for adenopathy.  Psychiatric/Behavioral: Negative.  The patient is not nervous/anxious.       Objective:    Physical Exam Vitals and nursing note reviewed.  Constitutional:      Appearance: Normal appearance. He is well-developed.  HENT:     Head: Normocephalic and atraumatic.     Right Ear: No tenderness. Tympanic membrane is bulging. Tympanic membrane is not erythematous.     Left Ear: No tenderness. Tympanic membrane is not erythematous or bulging.     Nose: No congestion or rhinorrhea.     Right Sinus: No maxillary sinus tenderness or frontal sinus tenderness.     Left Sinus: No maxillary sinus tenderness or frontal sinus tenderness.     Mouth/Throat:     Pharynx: No posterior oropharyngeal erythema.  Eyes:     Pupils: Pupils are equal, round, and reactive to light.  Cardiovascular:     Rate and Rhythm: Normal rate and regular rhythm.     Pulses: Normal pulses.     Heart sounds: Normal heart sounds.  Pulmonary:     Effort: Pulmonary effort is normal.     Breath sounds: Normal breath sounds.  Abdominal:     General: Bowel sounds are normal.     Palpations: Abdomen is soft.     Tenderness: There is no abdominal tenderness.  Musculoskeletal:        General: Normal range of motion.     Cervical back: Normal range of motion and neck supple.  Skin:    General: Skin is warm and dry.     Capillary Refill: Capillary refill takes less than 2 seconds.  Neurological:     General: No focal deficit present.     Mental Status: He is alert and oriented to person, place, and time.  Psychiatric:        Mood and Affect: Mood normal.        Behavior: Behavior normal.        Thought Content: Thought content normal.        Judgment: Judgment normal.    Today's Vitals   08/04/21 1110  BP: 139/85  Pulse: 70  Temp: (!) 97.5 F (36.4 C)  SpO2: 97%  Weight: 259 lb 6.4 oz (117.7 kg)  Height: _0  (1.727 m)    Body mass index is 39.44 kg/m.   Wt Readings from Last 3 Encounters:  08/04/21 259 lb 6.4 oz (117.7 kg)  06/28/21 261 lb 9.6 oz (118.7 kg)  04/05/21 258 lb 14.4 oz (117.4 kg)    Health Maintenance Due  Topic  Date Due   COVID-19 Vaccine (1) Never done   Pneumococcal Vaccine 82-19 Years old (1 - PCV) Never done   HIV Screening  Never done   Hepatitis C Screening  Never done   Zoster Vaccines- Shingrix (1 of 2) Never done   TETANUS/TDAP  10/26/2019   INFLUENZA VACCINE  07/17/2021    There are no preventive care reminders to display for this patient.   Lab Results  Component Value Date   TSH 1.220 07/11/2021   Lab Results  Component Value Date   WBC 8.8 07/11/2021   HGB 13.8 07/11/2021   HCT 41.0 07/11/2021   MCV 90 07/11/2021   PLT 194 07/11/2021   Lab Results  Component Value Date   NA 140 07/11/2021   K 4.3 07/11/2021   CO2 22 07/11/2021   GLUCOSE 87 07/11/2021   BUN 12 07/11/2021   CREATININE 0.97 07/11/2021   BILITOT 0.4 07/11/2021   ALKPHOS 82 07/11/2021   AST 25 07/11/2021   ALT 42 07/11/2021   PROT 6.6 07/11/2021   ALBUMIN 4.3 07/11/2021   CALCIUM 9.2 07/11/2021   EGFR 94 07/11/2021   Lab Results  Component Value Date   CHOL 184 07/11/2021   Lab Results  Component Value Date   HDL 38 (L) 07/11/2021   Lab Results  Component Value Date   LDLCALC 83 07/11/2021   Lab Results  Component Value Date   TRIG 390 (H) 07/11/2021   Lab Results  Component Value Date   CHOLHDL 4.8 07/11/2021   Lab Results  Component Value Date   HGBA1C 5.7 (H) 07/11/2021       Assessment & Plan:  1. Non-seasonal allergic rhinitis, unspecified trigger Trial of Flonase nasal spray.  Advised patient to use up to 2 sprays in both nostrils daily.  May continue to use over-the-counter medication to control other acute symptoms.  Advised patient to notify the office if there is no improvement or worsening symptoms over next week or 2.  He is agreeable to this plan. -  fluticasone (FLONASE) 50 MCG/ACT nasal spray; Place 2 sprays into both nostrils daily.  Dispense: 16 g; Refill: 2   Problem List Items Addressed This Visit   None Visit Diagnoses     Non-seasonal allergic rhinitis, unspecified trigger    -  Primary   Relevant Medications   fluticasone (FLONASE) 50 MCG/ACT nasal spray        Meds ordered this encounter  Medications   fluticasone (FLONASE) 50 MCG/ACT nasal spray    Sig: Place 2 sprays into both nostrils daily.    Dispense:  16 g    Refill:  2    Order Specific Question:   Supervising Provider    Answer:   Beatrice Lecher D [[id number]   This note was dictated using Dragon Voice Recognition Software. Rapid proofreading was performed to expedite the delivery of the information. Despite proofreading, phonetic errors will occur which are common with this voice recognition software. Please take this into consideration. If there are any concerns, please contact our office.    Ronnell Freshwater, NP

## 2021-08-10 ENCOUNTER — Other Ambulatory Visit: Payer: Self-pay | Admitting: Physician Assistant

## 2021-08-10 DIAGNOSIS — F411 Generalized anxiety disorder: Secondary | ICD-10-CM

## 2021-08-14 ENCOUNTER — Telehealth: Payer: Self-pay | Admitting: Physician Assistant

## 2021-08-14 ENCOUNTER — Other Ambulatory Visit: Payer: Self-pay | Admitting: Nurse Practitioner

## 2021-08-14 DIAGNOSIS — H60333 Swimmer's ear, bilateral: Secondary | ICD-10-CM

## 2021-08-14 MED ORDER — CIPROFLOXACIN-DEXAMETHASONE 0.3-0.1 % OT SUSP: 4.0000 [drp] | Freq: Two times a day (BID) | OTIC | 0 refills | Status: DC

## 2021-08-14 NOTE — Telephone Encounter (Signed)
Patient was in office last week for fluid in his ear and the medication did not help. Please advise, thanks.

## 2021-08-14 NOTE — Progress Notes (Signed)
Trial of ciprodex ear drops. Insert 4 drops into effected ears twice daily for next 7 days. Continue to use flonase nasal spray as prescribed

## 2021-08-14 NOTE — Telephone Encounter (Signed)
Will do Trial of ciprodex ear drops. Insert 4 drops into effected ears twice daily for next 7 days. Continue to use flonase nasal spray as prescribed. If no better over next week, he should be seen for further evaluation. May need referral to ENT. Sent ear drops to [facility] [address].

## 2021-08-16 NOTE — Telephone Encounter (Signed)
Pt is aware of his Rx and has pick it up at the Pharmacy

## 2021-08-23 ENCOUNTER — Encounter: Payer: Self-pay | Admitting: Nurse Practitioner

## 2021-08-23 ENCOUNTER — Other Ambulatory Visit: Payer: Self-pay

## 2021-08-23 ENCOUNTER — Ambulatory Visit (INDEPENDENT_AMBULATORY_CARE_PROVIDER_SITE_OTHER): Payer: Commercial Managed Care - PPO | Admitting: Nurse Practitioner

## 2021-08-23 VITALS — BP 148/87 | HR 64 | Temp 97.7°F | Wt 259.6 lb

## 2021-08-23 DIAGNOSIS — H60333 Swimmer's ear, bilateral: Secondary | ICD-10-CM

## 2021-08-23 MED ORDER — CEFUROXIME AXETIL 500 MG PO TABS: 500.0000 mg | ORAL_TABLET | Freq: Two times a day (BID) | ORAL | 0 refills | Status: DC

## 2021-08-23 NOTE — Progress Notes (Signed)
Acute Office Visit  Subjective:    Patient ID: Garrett Holmes, male    DOB: 04-12-1966, 55 y.o.   MRN: [id number]  Chief Complaint  Patient presents with   Ear Pain    The patient is concerned about ear fullness and tenderness of right ear. He is getting ready to travel on airplane next Friday, September 01, 2021. Afraid this will make condition worse   Ear Fullness  There is pain in the right ear. This is a chronic problem. The current episode started 1 to 4 weeks ago. The problem occurs every few hours (mostly in the afternoons and evdenings). There has been no fever. The pain is mild. Associated symptoms include headaches. Pertinent negatives include no coughing, diarrhea, ear discharge, rash, rhinorrhea, sore throat or vomiting. He has tried ear drops, acetaminophen and NSAIDs (has had trial of flonase nasal spray and ciprodex ear drops.) for the symptoms. The treatment provided mild relief. There is no history of a chronic ear infection or a tympanostomy tube.    Past Medical History:  Diagnosis Date   Allergy    Anxiety    Depression    Dyslipidemia    Hyperlipidemia     Past Surgical History:  Procedure Laterality Date   COLONOSCOPY  2012   HUNG    Family History  Problem Relation Age of Onset   Cancer Mother 50       Renal cancer    Social History   Socioeconomic History   Marital status: Married    Spouse name: Not on file   Number of children: Not on file   Years of education: Not on file   Highest education level: Not on file  Occupational History   Not on file  Tobacco Use   Smoking status: Former    Types: Cigarettes    Quit date: 12/17/2010    Years since quitting: 10.6   Smokeless tobacco: Never  Vaping Use   Vaping Use: Never used  Substance and Sexual Activity   Alcohol use: Yes    Alcohol/week: 1.0 standard drink    Types: 1 Cans of beer per week   Drug use: Not Currently    Types: Morphine   Sexual activity: Yes  Other Topics Concern    Not on file  Social History Narrative   Not on file   Social Determinants of Health   Financial Resource Strain: Not on file  Food Insecurity: Not on file  Transportation Needs: Not on file  Physical Activity: Not on file  Stress: Not on file  Social Connections: Not on file  Intimate Partner Violence: Not on file    Outpatient Medications Prior to Visit  Medication Sig Dispense Refill   ALPRAZolam (XANAX) 0.25 MG tablet Take 1 tablet (0.25 mg total) by mouth 2 (two) times daily as needed for anxiety. 20 tablet 0   atorvastatin (LIPITOR) 40 MG tablet Take 1 tablet by mouth once daily 30 tablet 0   ciprofloxacin-dexamethasone (CIPRODEX) OTIC suspension Place 4 drops into both ears 2 (two) times daily. 7.5 mL 0   fluticasone (FLONASE) 50 MCG/ACT nasal spray Place 2 sprays into both nostrils daily. 16 g 2   hydrOXYzine (ATARAX/VISTARIL) 10 MG tablet TAKE 1 TABLET BY MOUTH EVERY 8 HOURS AS NEEDED FOR ANXIETY 30 tablet 0   linaclotide (LINZESS) 145 MCG CAPS capsule Take 1 capsule (145 mcg total) by mouth daily. PRN per patient 30 capsule 2   methocarbamol (ROBAXIN) 500 MG tablet Take 1  tablet (500 mg total) by mouth every 8 (eight) hours as needed for muscle spasms. 30 tablet 0   venlafaxine XR (EFFEXOR-XR) 75 MG 24 hr capsule TAKE 1 CAPSULE BY MOUTH ONCE DAILY WITH BREAKFAST 30 capsule 0   Facility-Administered Medications Prior to Visit  Medication Dose Route Frequency Provider Last Rate Last Admin   methylPREDNISolone sodium succinate (SOLU-MEDROL) 130 mg in sodium chloride 0.9 % 50 mL IVPB  130 mg Intravenous Once Abonza, Maritza, PA-C        Allergies  Allergen Reactions   Sudafed [Pseudoephedrine Hcl]     Wires him up, agitation    Review of Systems  Constitutional:  Negative for chills, fatigue and fever.  HENT:  Negative for congestion, ear discharge, postnasal drip, rhinorrhea, sinus pressure, sinus pain, sneezing and sore throat.   Eyes: Negative.   Respiratory:   Negative for cough, shortness of breath and wheezing.   Cardiovascular:  Negative for chest pain and palpitations.  Gastrointestinal:  Negative for constipation, diarrhea, nausea and vomiting.  Endocrine: Negative for cold intolerance, heat intolerance, polydipsia and polyuria.  Genitourinary:  Negative for dysuria, frequency and urgency.  Musculoskeletal:  Negative for back pain and myalgias.  Skin:  Negative for rash.  Allergic/Immunologic: Positive for environmental allergies.  Neurological:  Positive for headaches. Negative for dizziness and weakness.  Hematological:  Negative for adenopathy.  Psychiatric/Behavioral:  The patient is not nervous/anxious.       Objective:    Physical Exam Vitals and nursing note reviewed.  Constitutional:      Appearance: Normal appearance. He is well-developed. He is obese.  HENT:     Head: Normocephalic and atraumatic.     Right Ear: Hearing normal. Tenderness present. Tympanic membrane is erythematous and bulging.     Left Ear: Hearing normal. Tympanic membrane is bulging.  Eyes:     Pupils: Pupils are equal, round, and reactive to light.  Cardiovascular:     Rate and Rhythm: Normal rate and regular rhythm.     Pulses: Normal pulses.     Heart sounds: Normal heart sounds.  Pulmonary:     Effort: Pulmonary effort is normal.     Breath sounds: Normal breath sounds.  Abdominal:     Palpations: Abdomen is soft.  Musculoskeletal:        General: Normal range of motion.     Cervical back: Normal range of motion and neck supple.  Lymphadenopathy:     Cervical: No cervical adenopathy.  Skin:    General: Skin is warm and dry.     Capillary Refill: Capillary refill takes less than 2 seconds.  Neurological:     General: No focal deficit present.     Mental Status: He is alert and oriented to person, place, and time.  Psychiatric:        Mood and Affect: Mood normal.        Behavior: Behavior normal.        Thought Content: Thought content  normal.        Judgment: Judgment normal.    08/23/21 0810  BP: (!) 148/87  Pulse: 64  Temp: 97.7 F (36.5 C)  SpO2: 96%  Weight: 259 lb 9.6 oz (117.8 kg)   Body mass index is 39.47 kg/m. .  Wt Readings from Last 3 Encounters:  08/23/21 259 lb 9.6 oz (117.8 kg)  08/04/21 259 lb 6.4 oz (117.7 kg)  06/28/21 261 lb 9.6 oz (118.7 kg)    Health Maintenance Due  Topic  Date Due   COVID-19 Vaccine (1) Never done   Pneumococcal Vaccine 49-97 Years old (1 - PCV) Never done   HIV Screening  Never done   Hepatitis C Screening  Never done   Zoster Vaccines- Shingrix (1 of 2) Never done   TETANUS/TDAP  10/26/2019   INFLUENZA VACCINE  Never done    There are no preventive care reminders to display for this patient.   Lab Results  Component Value Date   TSH 1.220 07/11/2021   Lab Results  Component Value Date   WBC 8.8 07/11/2021   HGB 13.8 07/11/2021   HCT 41.0 07/11/2021   MCV 90 07/11/2021   PLT 194 07/11/2021   Lab Results  Component Value Date   NA 140 07/11/2021   K 4.3 07/11/2021   CO2 22 07/11/2021   GLUCOSE 87 07/11/2021   BUN 12 07/11/2021   CREATININE 0.97 07/11/2021   BILITOT 0.4 07/11/2021   ALKPHOS 82 07/11/2021   AST 25 07/11/2021   ALT 42 07/11/2021   PROT 6.6 07/11/2021   ALBUMIN 4.3 07/11/2021   CALCIUM 9.2 07/11/2021   EGFR 94 07/11/2021   Lab Results  Component Value Date   CHOL 184 07/11/2021   Lab Results  Component Value Date   HDL 38 (L) 07/11/2021   Lab Results  Component Value Date   LDLCALC 83 07/11/2021   Lab Results  Component Value Date   TRIG 390 (H) 07/11/2021   Lab Results  Component Value Date   CHOLHDL 4.8 07/11/2021   Lab Results  Component Value Date   HGBA1C 5.7 (H) 07/11/2021       Assessment & Plan:  1. Chronic swimmer's ear of both sides Start ceftin due to chronicity. Take twice daily for 7 days. Recommend he continue with using ciprodex ear drops using four drops in both ears twice daily. He may apply  warm compress to ears to help relieve pain and pressure. He should contact office if symptoms worsen or do not improve in next 5 to 7 days, he voiced understanding and agreement with this plan.  - cefUROXime (CEFTIN) 500 MG tablet; Take 1 tablet (500 mg total) by mouth 2 (two) times daily with a meal.  Dispense: 14 tablet; Refill: 0   Problem List Items Addressed This Visit       Nervous and Auditory   Chronic swimmer's ear of both sides - Primary   Relevant Medications   cefUROXime (CEFTIN) 500 MG tablet     Meds ordered this encounter  Medications   cefUROXime (CEFTIN) 500 MG tablet    Sig: Take 1 tablet (500 mg total) by mouth 2 (two) times daily with a meal.    Dispense:  14 tablet    Refill:  0      Ronnell Freshwater, NP

## 2021-09-20 ENCOUNTER — Other Ambulatory Visit: Payer: Self-pay | Admitting: Physician Assistant

## 2021-09-20 DIAGNOSIS — F411 Generalized anxiety disorder: Secondary | ICD-10-CM

## 2021-09-22 IMAGING — DX DG CHEST 2V
2 series · 2 of 2 positions shown · non-contrast
Comparison: 02/15/2015

CLINICAL DATA: Dyspnea on exertion for 2 weeks. History of anxiety
and hyperlipidemia.

EXAM:
CHEST - 2 VIEW

[dg chest 2 view (1 of 2)]
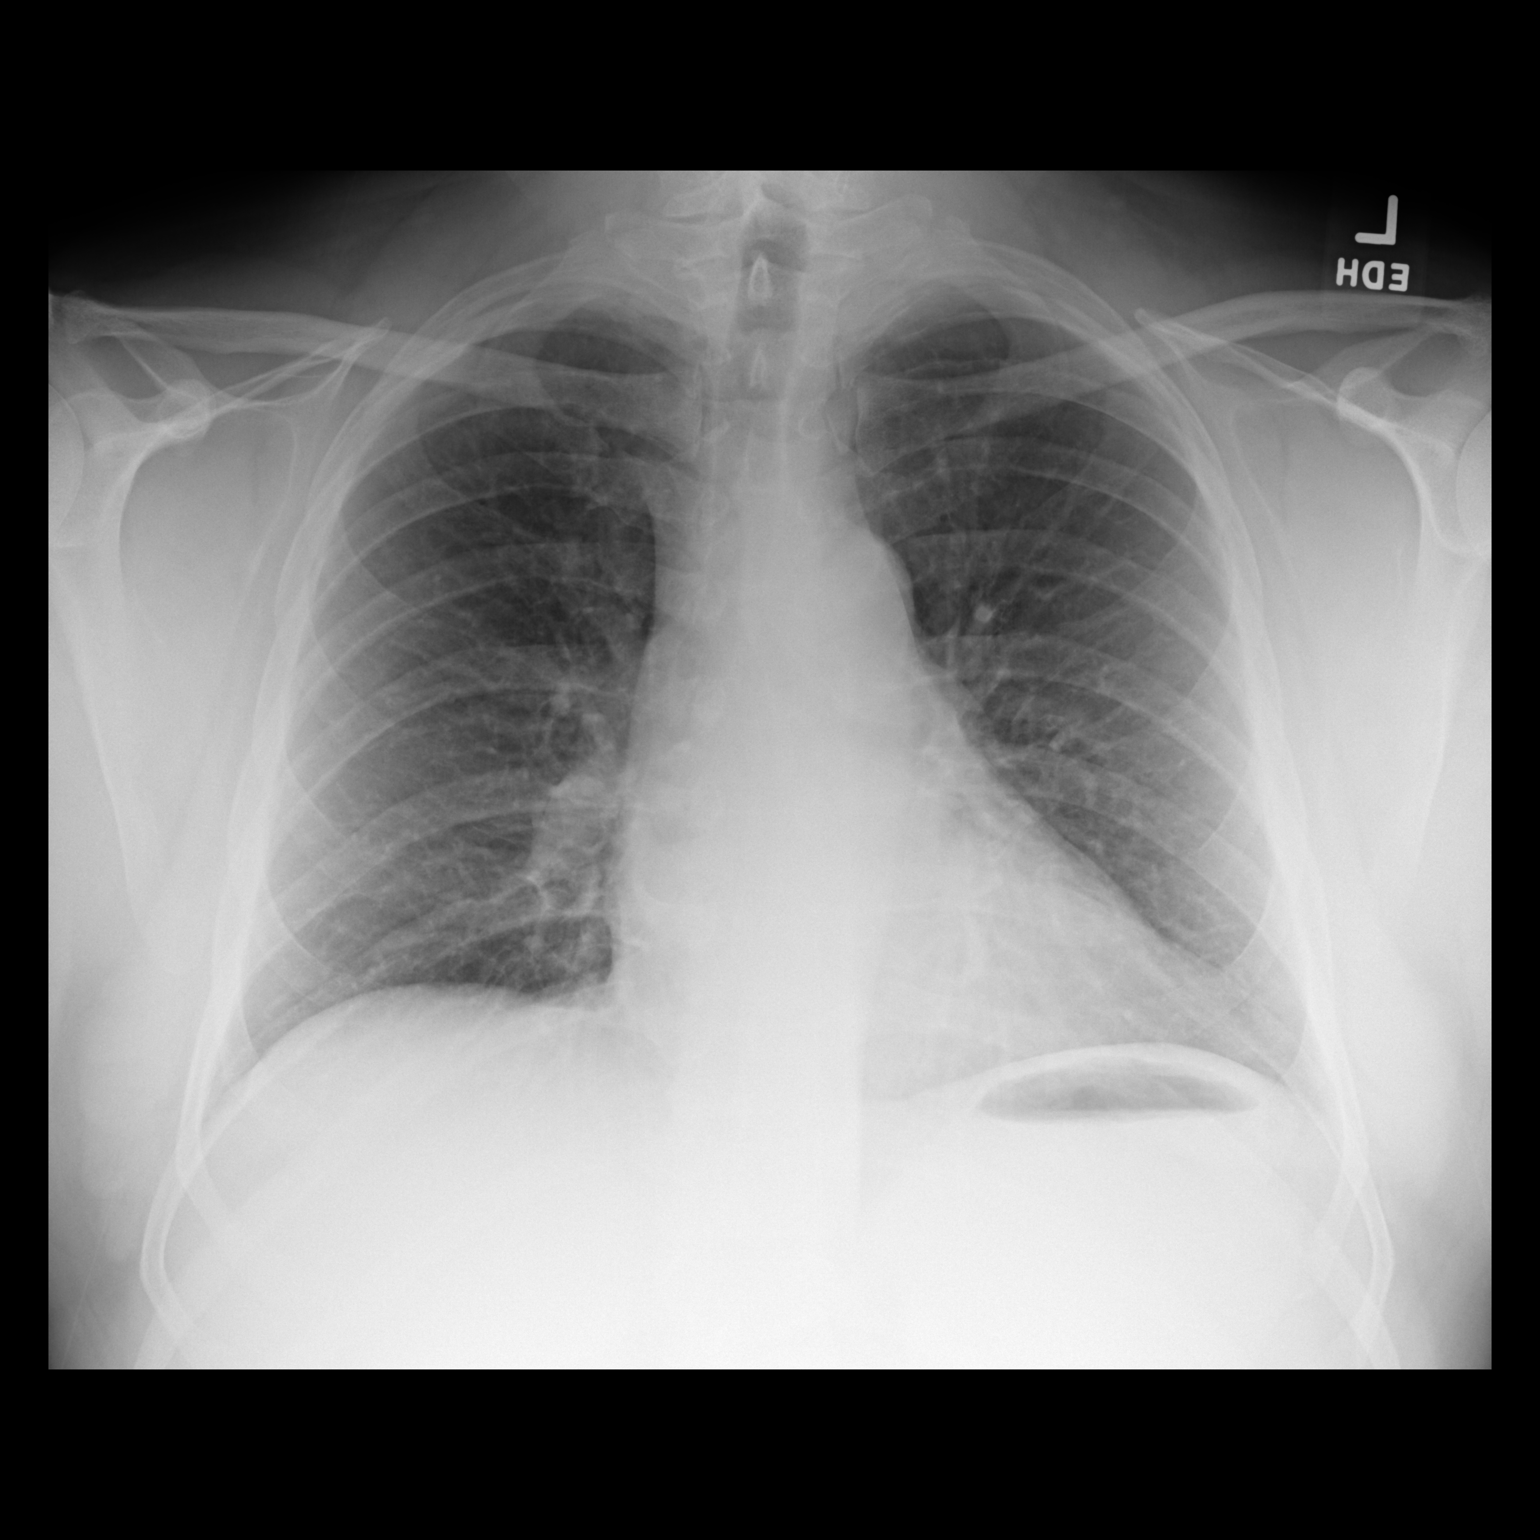

[dg chest 2 view (2 of 2)]
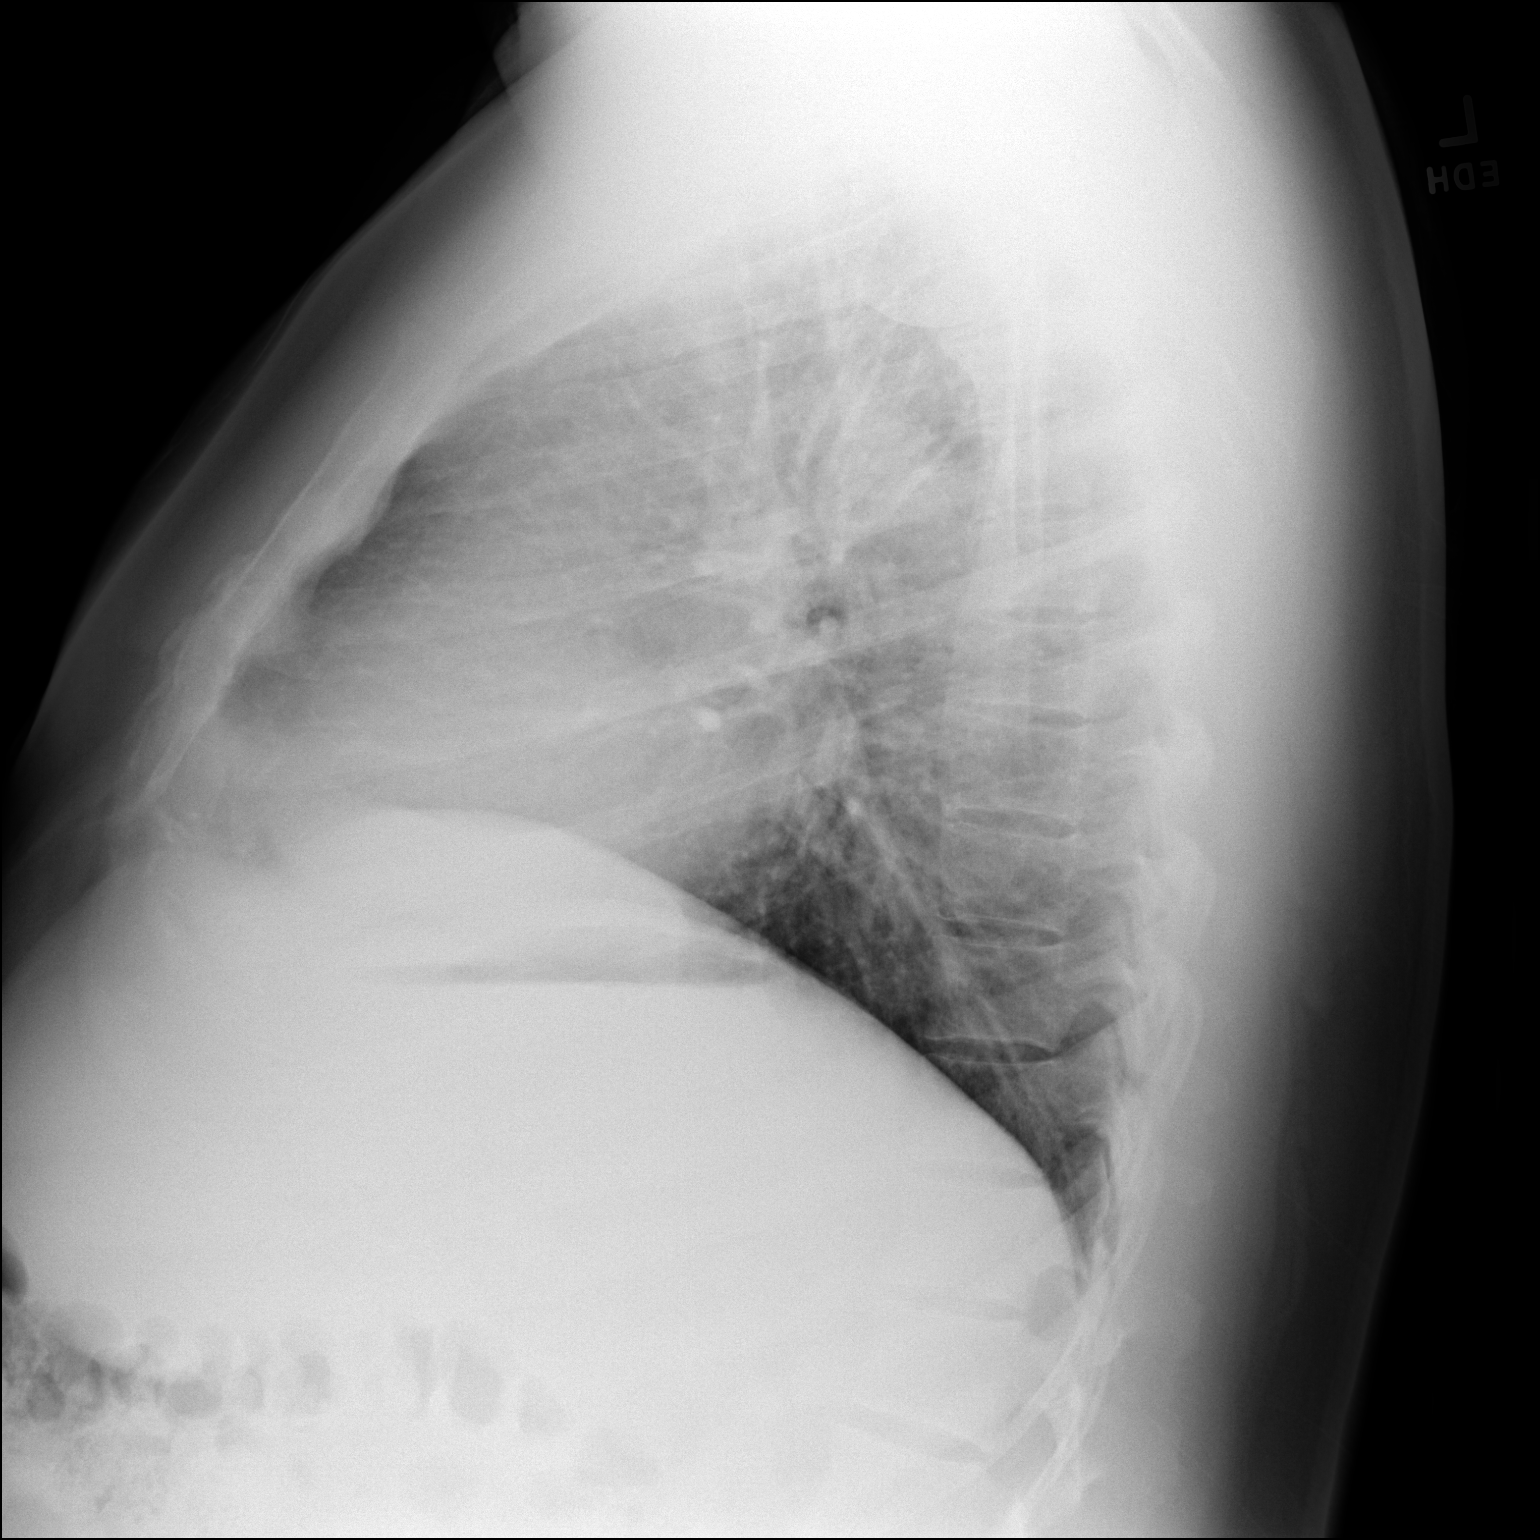

[2 of 2 positions shown; findings below may reference images not displayed]

FINDINGS: The heart size and mediastinal contours are within normal limits.
Both lungs are clear. The visualized skeletal structures are
unremarkable.
IMPRESSION: No active cardiopulmonary disease.

## 2021-10-15 ENCOUNTER — Other Ambulatory Visit: Payer: Self-pay | Admitting: Physician Assistant

## 2021-10-15 DIAGNOSIS — F411 Generalized anxiety disorder: Secondary | ICD-10-CM

## 2021-10-15 DIAGNOSIS — K5900 Constipation, unspecified: Secondary | ICD-10-CM

## 2021-10-31 ENCOUNTER — Ambulatory Visit: Payer: Commercial Managed Care - PPO | Admitting: Physician Assistant

## 2021-10-31 ENCOUNTER — Encounter: Payer: Self-pay | Admitting: Physician Assistant

## 2021-10-31 ENCOUNTER — Other Ambulatory Visit: Payer: Self-pay

## 2021-10-31 VITALS — BP 150/90 | HR 66 | Temp 98.0°F | Wt 264.0 lb

## 2021-10-31 DIAGNOSIS — G4700 Insomnia, unspecified: Secondary | ICD-10-CM | POA: Diagnosis not present

## 2021-10-31 DIAGNOSIS — E785 Hyperlipidemia, unspecified: Secondary | ICD-10-CM

## 2021-10-31 DIAGNOSIS — K5900 Constipation, unspecified: Secondary | ICD-10-CM

## 2021-10-31 DIAGNOSIS — F411 Generalized anxiety disorder: Secondary | ICD-10-CM

## 2021-10-31 DIAGNOSIS — G2581 Restless legs syndrome: Secondary | ICD-10-CM

## 2021-10-31 DIAGNOSIS — I10 Essential (primary) hypertension: Secondary | ICD-10-CM

## 2021-10-31 MED ORDER — GABAPENTIN 100 MG PO CAPS: 100.0000 mg | ORAL_CAPSULE | Freq: Every day | ORAL | 0 refills | Status: DC

## 2021-10-31 MED ORDER — LISINOPRIL 5 MG PO TABS: 5.0000 mg | ORAL_TABLET | Freq: Every day | ORAL | 1 refills | Status: DC

## 2021-10-31 NOTE — Assessment & Plan Note (Addendum)
-  Patient's blood pressure elevated last office visit and remains elevated today, discussed hypretension dx and management options including medications and potential side effects. Patient agreeable to starting low dose ACEi- Lisinopril 5 mg (Last CMP, renal function and electrolytes normal). Recommend to let me know if unable to tolerate medication. -Recommend to monitor BP/pulse at home few times per week and keep a log to bring to next office visit. -Follow low sodium diet. -Will continue to monitor.

## 2021-10-31 NOTE — Progress Notes (Signed)
Established Patient Office Visit  Subjective:  Patient ID: Garrett Holmes, male    DOB: 05/31/1966  Age: 55 y.o. MRN: [id number]  CC:  Chief Complaint  Patient presents with   Follow-up    Mood, Insomnia, HTN     HPI [name] presents for follow up on hyperlipidemia, mood and insomnia. Patient has c/o restless legs which does interfere with his sleep. States in the past has tried Requip and metoprolol which were ineffective. Reports has about 1 cup of caffeine per day in t he mornings, tries to limit use. Drinks about 8-10 beers on the weekends.  Elevated BP: Patient does not check blood pressure at home, occasionally may check it at the pharmacy. Denies chest pain, palpitations, dizziness, vision changes or lower extremity swelling.   HLD: Pt taking medication as directed without issues. Tries to monitor fat intake as best as possible given his busy schedule.   Mood: States anxiety is good. Denies SI/HI, severe anxiety or panic attack. States has been taking medication consistently without problems.   Insomnia: Sleep is about the same, has trouble sleeping at night due to RLS and also does not stay asleep.   Constipation: Taking Linzess without issues. Reports has a bowel movement daily.  Past Medical History:  Diagnosis Date   Allergy    Anxiety    Depression    Dyslipidemia    Hyperlipidemia     Past Surgical History:  Procedure Laterality Date   COLONOSCOPY  2012   HUNG    Family History  Problem Relation Age of Onset   Cancer Mother 45       Renal cancer    Social History   Socioeconomic History   Marital status: Married    Spouse name: Not on file   Number of children: Not on file   Years of education: Not on file   Highest education level: Not on file  Occupational History   Not on file  Tobacco Use   Smoking status: Former    Types: Cigarettes    Quit date: 12/17/2010    Years since quitting: 10.8   Smokeless tobacco: Never  Vaping Use    Vaping Use: Never used  Substance and Sexual Activity   Alcohol use: Yes    Alcohol/week: 1.0 standard drink    Types: 1 Cans of beer per week   Drug use: Not Currently    Types: Morphine   Sexual activity: Yes  Other Topics Concern   Not on file  Social History Narrative   Not on file   Social Determinants of Health   Financial Resource Strain: Not on file  Food Insecurity: Not on file  Transportation Needs: Not on file  Physical Activity: Not on file  Stress: Not on file  Social Connections: Not on file  Intimate Partner Violence: Not on file    Outpatient Medications Prior to Visit  Medication Sig Dispense Refill   ALPRAZolam (XANAX) 0.25 MG tablet Take 1 tablet (0.25 mg total) by mouth 2 (two) times daily as needed for anxiety. 20 tablet 0   atorvastatin (LIPITOR) 40 MG tablet Take 1 tablet by mouth once daily 30 tablet 0   cefUROXime (CEFTIN) 500 MG tablet Take 1 tablet (500 mg total) by mouth 2 (two) times daily with a meal. 14 tablet 0   ciprofloxacin-dexamethasone (CIPRODEX) OTIC suspension Place 4 drops into both ears 2 (two) times daily. 7.5 mL 0   fluticasone (FLONASE) 50 MCG/ACT nasal spray Place  2 sprays into both nostrils daily. 16 g 2   hydrOXYzine (ATARAX/VISTARIL) 10 MG tablet TAKE 1 TABLET BY MOUTH EVERY 8 HOURS AS NEEDED FOR ANXIETY 30 tablet 1   LINZESS 145 MCG CAPS capsule TAKE 1 CAPSULE BY MOUTH ONCE DAILY AS NEEDED 90 capsule 1   methocarbamol (ROBAXIN) 500 MG tablet Take 1 tablet (500 mg total) by mouth every 8 (eight) hours as needed for muscle spasms. 30 tablet 0   venlafaxine XR (EFFEXOR-XR) 75 MG 24 hr capsule TAKE 1 CAPSULE BY MOUTH ONCE DAILY WITH BREAKFAST 90 capsule 1   Facility-Administered Medications Prior to Visit  Medication Dose Route Frequency Provider Last Rate Last Admin   methylPREDNISolone sodium succinate (SOLU-MEDROL) 130 mg in sodium chloride 0.9 % 50 mL IVPB  130 mg Intravenous Once Kahliya Fraleigh, PA-C        Allergies   Allergen Reactions   Sudafed [Pseudoephedrine Hcl]     Wires him up, agitation    ROS Review of Systems Review of Systems:  A fourteen system review of systems was performed and found to be positive as per HPI.  Objective:    Physical Exam General:  Well Developed, well nourished, appropriate for stated age.  Neuro:  Alert and oriented,  extra-ocular muscles intact  HEENT:  Normocephalic, atraumatic, neck supple Skin:  no gross rash, warm, pink. Cardiac:  RRR Respiratory:  CTA B/L w/o wheezing, Not using accessory muscles, speaking in full sentences- unlabored. Vascular:  Ext warm, no cyanosis apprec.; cap RF less 2 sec. No edema  Psych:  No HI/SI, judgement and insight good, Euthymic mood. Full Affect.  BP (!) 150/90   Pulse 66   Temp 98 F (36.7 C)   Wt 264 lb (119.7 kg)   SpO2 98%   BMI 40.14 kg/m  Wt Readings from Last 3 Encounters:  10/31/21 264 lb (119.7 kg)  08/23/21 259 lb 9.6 oz (117.8 kg)  08/04/21 259 lb 6.4 oz (117.7 kg)     Health Maintenance Due  Topic Date Due   COVID-19 Vaccine (1) Never done   Pneumococcal Vaccine 8-66 Years old (1 - PCV) Never done   HIV Screening  Never done   Hepatitis C Screening  Never done   Zoster Vaccines- Shingrix (1 of 2) Never done   TETANUS/TDAP  10/26/2019   INFLUENZA VACCINE  Never done    There are no preventive care reminders to display for this patient.  Lab Results  Component Value Date   TSH 1.220 07/11/2021   Lab Results  Component Value Date   WBC 8.8 07/11/2021   HGB 13.8 07/11/2021   HCT 41.0 07/11/2021   MCV 90 07/11/2021   PLT 194 07/11/2021   Lab Results  Component Value Date   NA 140 07/11/2021   K 4.3 07/11/2021   CO2 22 07/11/2021   GLUCOSE 87 07/11/2021   BUN 12 07/11/2021   CREATININE 0.97 07/11/2021   BILITOT 0.4 07/11/2021   ALKPHOS 82 07/11/2021   AST 25 07/11/2021   ALT 42 07/11/2021   PROT 6.6 07/11/2021   ALBUMIN 4.3 07/11/2021   CALCIUM 9.2 07/11/2021   EGFR 94  07/11/2021   Lab Results  Component Value Date   CHOL 184 07/11/2021   Lab Results  Component Value Date   HDL 38 (L) 07/11/2021   Lab Results  Component Value Date   LDLCALC 83 07/11/2021   Lab Results  Component Value Date   TRIG 390 (H) 07/11/2021   Lab  Results  Component Value Date   CHOLHDL 4.8 07/11/2021   Lab Results  Component Value Date   HGBA1C 5.7 (H) 07/11/2021    Depression screen PHQ 2/9 10/31/2021 08/23/2021 08/04/2021 06/28/2021 04/05/2021  Decreased Interest 0 0 0 0 0  Down, Depressed, Hopeless 0 0 0 0 0  PHQ - 2 Score 0 0 0 0 0  Altered sleeping 3 0 0 2 3  Tired, decreased energy 0 0 0 0 1  Change in appetite 0 0 0 0 0  Feeling bad or failure about yourself  0 0 0 0 0  Trouble concentrating 0 0 0 0 0  Moving slowly or fidgety/restless 0 0 0 0 0  Suicidal thoughts 0 0 0 0 0  PHQ-9 Score 3 0 0 2 4  Difficult doing work/chores Not difficult at all - - Not difficult at all -  Some recent data might be hidden   GAD 7 : Generalized Anxiety Score 10/31/2021 08/04/2021 06/28/2021 02/23/2020  Nervous, Anxious, on Edge 0 0 1 0  Control/stop worrying 0 0 0 0  Worry too much - different things 0 0 0 0  Trouble relaxing 0 0 1 1  Restless 1 0 0 3  Easily annoyed or irritable 0 0 1 1  Afraid - awful might happen 0 0 0 0  Total GAD 7 Score 1 0 3 5  Anxiety Difficulty Not difficult at all - Not difficult at all Not difficult at all     Assessment & Plan:   Problem List Items Addressed This Visit       Cardiovascular and Mediastinum   Hypertension    -Patient's blood pressure elevated last office visit and remains elevated today, discussed hypretension dx and management options including medications and potential side effects. Patient agreeable to starting low dose ACEi- Lisinopril 5 mg (Last CMP, renal function and electrolytes normal). Recommend to let me know if unable to tolerate medication. -Recommend to monitor BP/pulse at home few times per week and keep a  log to bring to next office visit. -Follow low sodium diet. -Will continue to monitor.      Relevant Medications   lisinopril (ZESTRIL) 5 MG tablet     Other   GAD (generalized anxiety disorder) (Chronic)    -Stable. -Continue current medication regimen. -Will continue to monitor.      Hyperlipidemia - Primary   Relevant Medications   lisinopril (ZESTRIL) 5 MG tablet   Constipation    -Stable. -Continue current medication regimen. -Will continue to monitor.      Other Visit Diagnoses     Insomnia, unspecified type       Restless leg syndrome       Relevant Medications   gabapentin (NEURONTIN) 100 MG capsule      Restless leg syndrome: -Discussed with patient management options including nonpharmacologic therapy such as avoiding caffeine and alcohol use. Recommend to reduce alcohol use to 2 drinks/day. Patient agreeable to trial Gabapentin and will start at 100 mg before bedtime. Discussed potential side effects including drowsiness, recommend cautious use. Recommend to take 100 mg before bedtime x 4 weeks and if symptoms fail to improve or worsen will consider increasing to 200 mg. Patient verbalized understanding. Will continue to monitor.   Insomnia: -Discussed good sleep hygiene. Patient started on medication for RLS which could provider some benefits for insomnia. -Will continue to monitor.   Meds ordered this encounter  Medications   gabapentin (NEURONTIN) 100 MG capsule  Sig: Take 1 capsule (100 mg total) by mouth at bedtime.    Dispense:  90 capsule    Refill:  0    Order Specific Question:   Supervising Provider    Answer:   Beatrice Lecher D [[id number]]   lisinopril (ZESTRIL) 5 MG tablet    Sig: Take 1 tablet (5 mg total) by mouth daily.    Dispense:  90 tablet    Refill:  1    Order Specific Question:   Supervising Provider    Answer:   Beatrice Lecher D [[id number]]     Follow-up: Return for CPE and FBW include PSA a few days prior in 3-4 months.     Lorrene Reid, PA-C

## 2021-10-31 NOTE — Assessment & Plan Note (Signed)
-  Stable. -Continue current medication regimen.  -Will continue to monitor. 

## 2021-10-31 NOTE — Patient Instructions (Addendum)
Restless Legs Syndrome Restless legs syndrome is a condition that causes uncomfortable feelings or sensations in the legs, especially while sitting or lying down. The sensations usually cause an overwhelming urge to move the legs. The arms can also sometimes be affected. The condition can range from mild to severe. The symptoms often interfere with a person's ability to sleep. What are the causes? The cause of this condition is not known. What increases the risk? The following factors may make you more likely to develop this condition: Being older than 50. Pregnancy. Being a woman. In general, the condition is more common in women than in men. A family history of the condition. Having iron deficiency. Overuse of caffeine, nicotine, or alcohol. Certain medical conditions, such as kidney disease, Parkinson's disease, or nerve damage. Certain medicines, such as those for high blood pressure, nausea, colds, allergies, depression, and some heart conditions. What are the signs or symptoms? The main symptom of this condition is uncomfortable sensations in the legs, such as: Pulling. Tingling. Prickling. Throbbing. Crawling. Burning. Usually, the sensations: Affect both sides of the body. Are worse when you sit or lie down. Are worse at night. These may make it difficult to fall asleep. Make you have a strong urge to move your legs. Are temporarily relieved by moving your legs or standing. The arms can also be affected, but this is rare. People who have this condition often have tiredness during the day because of their lack of sleep at night. How is this diagnosed? This condition may be diagnosed based on: Your symptoms. Blood tests. In some cases, you may be monitored in a sleep lab by a specialist (a sleep study). This can detect any disruptions in your sleep. How is this treated? This condition is treated by managing the symptoms. This may include: Lifestyle changes, such as  exercising, using relaxation techniques, and avoiding caffeine, alcohol, or tobacco. Iron supplements. Medicines. Parkinson's medications may be tried first. Anti-seizure medications can also be helpful. Follow these instructions at home: General instructions Take over-the-counter and prescription medicines only as told by your health care provider. Use methods to help relieve the uncomfortable sensations, such as: Massaging your legs. Walking or stretching. Taking a cold or hot bath. Keep all follow-up visits. This is important. Lifestyle   Practice good sleep habits. For example, go to bed and get up at the same time every day. Most adults should get 7-9 hours of sleep each night. Exercise regularly. Try to get at least 30 minutes of exercise most days of the week. Practice ways of relaxing, such as yoga or meditation. Avoid caffeine and alcohol. Do not use any products that contain nicotine or tobacco. These products include cigarettes, chewing tobacco, and vaping devices, such as e-cigarettes. If you need help quitting, ask your health care provider. Where to find more information [employer] of Neurological Disorders and Stroke: MasterBoxes.it Contact a health care provider if: Your symptoms get worse or they do not improve with treatment. Summary Restless legs syndrome is a condition that causes uncomfortable feelings or sensations in the legs, especially while sitting or lying down. The symptoms often interfere with your ability to sleep. This condition is treated by managing the symptoms. You may need to make lifestyle changes or take medicines. This information is not intended to replace advice given to you by your health care provider. Make sure you discuss any questions you have with your health care provider. Document Revised: 07/16/2021 Document Reviewed: 07/16/2021 Elsevier Patient Education  2022  [place] stands for Dietary  Approaches to Stop Hypertension. The DASH eating plan is a healthy eating plan that has been shown to: Reduce high blood pressure (hypertension). Reduce your risk for type 2 diabetes, heart disease, and stroke. Help with weight loss. What are tips for following this plan? Reading food labels Check food labels for the amount of salt (sodium) per serving. Choose foods with less than 5 percent of the Daily Value of sodium. Generally, foods with less than 300 milligrams (mg) of sodium per serving fit into this eating plan. To find whole grains, look for the word "whole" as the first word in the ingredient list. Shopping Buy products labeled as "low-sodium" or "no salt added." Buy fresh foods. Avoid canned foods and pre-made or frozen meals. Cooking Avoid adding salt when cooking. Use salt-free seasonings or herbs instead of table salt or sea salt. Check with your health care provider or pharmacist before using salt substitutes. Do not fry foods. Cook foods using healthy methods such as baking, boiling, grilling, roasting, and broiling instead. Cook with heart-healthy oils, such as olive, canola, avocado, soybean, or sunflower oil. Meal planning  Eat a balanced diet that includes: 4 or more servings of fruits and 4 or more servings of vegetables each day. Try to fill one-half of your plate with fruits and vegetables. 6-8 servings of whole grains each day. Less than 6 oz (170 g) of lean meat, poultry, or fish each day. A 3-oz (85-g) serving of meat is about the same size as a deck of cards. One egg equals 1 oz (28 g). 2-3 servings of low-fat dairy each day. One serving is 1 cup (237 mL). 1 serving of nuts, seeds, or beans 5 times each week. 2-3 servings of heart-healthy fats. Healthy fats called omega-3 fatty acids are found in foods such as walnuts, flaxseeds, fortified milks, and eggs. These fats are also found in cold-water fish, such as sardines, salmon, and mackerel. Limit how much you eat  of: Canned or prepackaged foods. Food that is high in trans fat, such as some fried foods. Food that is high in saturated fat, such as fatty meat. Desserts and other sweets, sugary drinks, and other foods with added sugar. Full-fat dairy products. Do not salt foods before eating. Do not eat more than 4 egg yolks a week. Try to eat at least 2 vegetarian meals a week. Eat more home-cooked food and less restaurant, buffet, and fast food. Lifestyle When eating at a restaurant, ask that your food be prepared with less salt or no salt, if possible. If you drink alcohol: Limit how much you use to: 0-1 drink a day for women who are not pregnant. 0-2 drinks a day for men. Be aware of how much alcohol is in your drink. In the [place] one drink equals one 12 oz bottle of beer (355 mL), one 5 oz glass of wine (148 mL), or one 1 oz glass of hard liquor (44 mL). General information Avoid eating more than 2,300 mg of salt a day. If you have hypertension, you may need to reduce your sodium intake to 1,500 mg a day. Work with your health care provider to maintain a healthy body weight or to lose weight. Ask what an ideal weight is for you. Get at least 30 minutes of exercise that causes your heart to beat faster (aerobic exercise) most days of the week. Activities may include walking, swimming, or biking. Work with your health  care provider or dietitian to adjust your eating plan to your individual calorie needs. What foods should I eat? Fruits All fresh, dried, or frozen fruit. Canned fruit in natural juice (without added sugar). Vegetables Fresh or frozen vegetables (raw, steamed, roasted, or grilled). Low-sodium or reduced-sodium tomato and vegetable juice. Low-sodium or reduced-sodium tomato sauce and tomato paste. Low-sodium or reduced-sodium canned vegetables. Grains Whole-grain or whole-wheat bread. Whole-grain or whole-wheat pasta. Brown rice. [place]. Bulgur. Whole-grain and low-sodium  cereals. Pita bread. Low-fat, low-sodium crackers. Whole-wheat flour tortillas. Meats and other proteins Skinless chicken or [place]. Ground chicken or [place]. Pork with fat trimmed off. Fish and seafood. Egg whites. Dried beans, peas, or lentils. Unsalted nuts, nut butters, and seeds. Unsalted canned beans. Lean cuts of beef with fat trimmed off. Low-sodium, lean precooked or cured meat, such as sausages or meat loaves. Dairy Low-fat (1%) or fat-free (skim) milk. Reduced-fat, low-fat, or fat-free cheeses. Nonfat, low-sodium ricotta or cottage cheese. Low-fat or nonfat yogurt. Low-fat, low-sodium cheese. Fats and oils Soft margarine without trans fats. Vegetable oil. Reduced-fat, low-fat, or light mayonnaise and salad dressings (reduced-sodium). Canola, safflower, olive, avocado, soybean, and sunflower oils. Avocado. Seasonings and condiments Herbs. Spices. Seasoning mixes without salt. Other foods Unsalted popcorn and pretzels. Fat-free sweets. The items listed above may not be a complete list of foods and beverages you can eat. Contact a dietitian for more information. What foods should I avoid? Fruits Canned fruit in a light or heavy syrup. Fried fruit. Fruit in cream or butter sauce. Vegetables Creamed or fried vegetables. Vegetables in a cheese sauce. Regular canned vegetables (not low-sodium or reduced-sodium). Regular canned tomato sauce and paste (not low-sodium or reduced-sodium). Regular tomato and vegetable juice (not low-sodium or reduced-sodium). Angie Fava. Olives. Grains Baked goods made with fat, such as croissants, muffins, or some breads. Dry pasta or rice meal packs. Meats and other proteins Fatty cuts of meat. Ribs. Fried meat. Berniece Salines. Bologna, salami, and other precooked or cured meats, such as sausages or meat loaves. Fat from the back of a pig (fatback). Bratwurst. Salted nuts and seeds. Canned beans with added salt. Canned or smoked fish. Whole eggs or egg yolks. Chicken or  [place] with skin. Dairy Whole or 2% milk, cream, and half-and-half. Whole or full-fat cream cheese. Whole-fat or sweetened yogurt. Full-fat cheese. Nondairy creamers. Whipped toppings. Processed cheese and cheese spreads. Fats and oils Butter. Stick margarine. Lard. Shortening. Ghee. Bacon fat. Tropical oils, such as coconut, palm kernel, or palm oil. Seasonings and condiments Onion salt, garlic salt, seasoned salt, table salt, and sea salt. Worcestershire sauce. Tartar sauce. Barbecue sauce. Teriyaki sauce. Soy sauce, including reduced-sodium. Steak sauce. Canned and packaged gravies. Fish sauce. Oyster sauce. Cocktail sauce. Store-bought horseradish. Ketchup. Mustard. Meat flavorings and tenderizers. Bouillon cubes. Hot sauces. Pre-made or packaged marinades. Pre-made or packaged taco seasonings. Relishes. Regular salad dressings. Other foods Salted popcorn and pretzels. The items listed above may not be a complete list of foods and beverages you should avoid. Contact a dietitian for more information. Where to find more information National Heart, Lung, and Blood Institute: https://wilson-eaton.com/ American Heart Association: www.heart.org Academy of Nutrition and Dietetics: www.eatright.[place]: www.kidney.org Summary The DASH eating plan is a healthy eating plan that has been shown to reduce high blood pressure (hypertension). It may also reduce your risk for type 2 diabetes, heart disease, and stroke. When on the DASH eating plan, aim to eat more fresh fruits and vegetables, whole grains, lean proteins, low-fat dairy, and  heart-healthy fats. With the DASH eating plan, you should limit salt (sodium) intake to 2,300 mg a day. If you have hypertension, you may need to reduce your sodium intake to 1,500 mg a day. Work with your health care provider or dietitian to adjust your eating plan to your individual calorie needs. This information is not intended to replace advice given to  you by your health care provider. Make sure you discuss any questions you have with your health care provider. Document Revised: 11/06/2019 Document Reviewed: 11/06/2019 Elsevier Patient Education  2022 [employer].

## 2021-12-02 ENCOUNTER — Other Ambulatory Visit: Payer: Self-pay | Admitting: Physician Assistant

## 2021-12-02 DIAGNOSIS — E781 Pure hyperglyceridemia: Secondary | ICD-10-CM

## 2021-12-02 DIAGNOSIS — E785 Hyperlipidemia, unspecified: Secondary | ICD-10-CM

## 2022-01-01 ENCOUNTER — Other Ambulatory Visit: Payer: Self-pay | Admitting: Physician Assistant

## 2022-01-01 ENCOUNTER — Ambulatory Visit: Payer: Commercial Managed Care - PPO | Admitting: Dermatology

## 2022-01-01 DIAGNOSIS — E781 Pure hyperglyceridemia: Secondary | ICD-10-CM

## 2022-01-01 DIAGNOSIS — E785 Hyperlipidemia, unspecified: Secondary | ICD-10-CM

## 2022-01-06 ENCOUNTER — Other Ambulatory Visit: Payer: Self-pay | Admitting: Physician Assistant

## 2022-01-06 DIAGNOSIS — G2581 Restless legs syndrome: Secondary | ICD-10-CM

## 2022-03-06 ENCOUNTER — Encounter: Payer: Self-pay | Admitting: Physician Assistant

## 2022-03-06 ENCOUNTER — Ambulatory Visit (INDEPENDENT_AMBULATORY_CARE_PROVIDER_SITE_OTHER): Payer: Commercial Managed Care - PPO | Admitting: Physician Assistant

## 2022-03-06 ENCOUNTER — Other Ambulatory Visit: Payer: Self-pay

## 2022-03-06 VITALS — BP 138/88 | HR 58 | Temp 97.9°F | Ht 68.0 in | Wt 264.0 lb

## 2022-03-06 DIAGNOSIS — Z130 Encounter for screening for diseases of the blood and blood-forming organs and certain disorders involving the immune mechanism: Secondary | ICD-10-CM

## 2022-03-06 DIAGNOSIS — Z1321 Encounter for screening for nutritional disorder: Secondary | ICD-10-CM

## 2022-03-06 DIAGNOSIS — I10 Essential (primary) hypertension: Secondary | ICD-10-CM

## 2022-03-06 DIAGNOSIS — E785 Hyperlipidemia, unspecified: Secondary | ICD-10-CM | POA: Diagnosis not present

## 2022-03-06 DIAGNOSIS — R5383 Other fatigue: Secondary | ICD-10-CM

## 2022-03-06 DIAGNOSIS — Z125 Encounter for screening for malignant neoplasm of prostate: Secondary | ICD-10-CM

## 2022-03-06 DIAGNOSIS — Z0000 Encounter for general adult medical examination without abnormal findings: Secondary | ICD-10-CM | POA: Diagnosis not present

## 2022-03-06 DIAGNOSIS — F411 Generalized anxiety disorder: Secondary | ICD-10-CM

## 2022-03-06 DIAGNOSIS — Z1329 Encounter for screening for other suspected endocrine disorder: Secondary | ICD-10-CM

## 2022-03-06 DIAGNOSIS — Z13228 Encounter for screening for other metabolic disorders: Secondary | ICD-10-CM

## 2022-03-06 DIAGNOSIS — R6882 Decreased libido: Secondary | ICD-10-CM

## 2022-03-06 MED ORDER — LISINOPRIL 5 MG PO TABS: 10.0000 mg | ORAL_TABLET | Freq: Every day | ORAL | 1 refills | Status: DC

## 2022-03-06 NOTE — Progress Notes (Signed)
? ?Complete physical exam ? ? ?Patient: Garrett Holmes   DOB: [birth year]   56 y.o. Male  MRN: [id number] ?Visit Date: 03/06/2022 ? ? ?Chief Complaint  ?Patient presents with  ? Annual Exam  ? ?Subjective  ?  ?Garrett Holmes is a 56 y.o. male who presents today for a complete physical exam.  ?He reports consuming a general diet. The patient does not participate in regular exercise at present. He generally feels fairly well. He does have additional problems to discuss today- fatigue (feels tired all the time) and decreased libido, inquiring about testosterone check. ? ? ? ?Past Medical History:  ?Diagnosis Date  ? Allergy   ? Anxiety   ? Depression   ? Dyslipidemia   ? Hyperlipidemia   ? ?Past Surgical History:  ?Procedure Laterality Date  ? COLONOSCOPY  2012  ? HUNG  ? ?Social History  ? ?Socioeconomic History  ? Marital status: Married  ?  Spouse name: Not on file  ? Number of children: Not on file  ? Years of education: Not on file  ? Highest education level: Not on file  ?Occupational History  ? Not on file  ?Tobacco Use  ? Smoking status: Former  ?  Types: Cigarettes  ?  Quit date: 12/17/2010  ?  Years since quitting: 11.2  ? Smokeless tobacco: Never  ?Vaping Use  ? Vaping Use: Never used  ?Substance and Sexual Activity  ? Alcohol use: Yes  ?  Alcohol/week: 1.0 standard drink  ?  Types: 1 Cans of beer per week  ? Drug use: Not Currently  ?  Types: Morphine  ? Sexual activity: Yes  ?Other Topics Concern  ? Not on file  ?Social History Narrative  ? Not on file  ? ?Social Determinants of Health  ? ?Financial Resource Strain: Not on file  ?Food Insecurity: Not on file  ?Transportation Needs: Not on file  ?Physical Activity: Not on file  ?Stress: Not on file  ?Social Connections: Not on file  ?Intimate Partner Violence: Not on file  ? ? ? ?Medications: ?Outpatient Medications Prior to Visit  ?Medication Sig  ? ALPRAZolam (XANAX) 0.25 MG tablet Take 1 tablet (0.25 mg total) by mouth 2 (two) times daily as needed for  anxiety.  ? atorvastatin (LIPITOR) 40 MG tablet Take 1 tablet by mouth once daily  ? fluticasone (FLONASE) 50 MCG/ACT nasal spray Place 2 sprays into both nostrils daily.  ? gabapentin (NEURONTIN) 100 MG capsule Take 1 capsule by mouth at bedtime  ? hydrOXYzine (ATARAX/VISTARIL) 10 MG tablet TAKE 1 TABLET BY MOUTH EVERY 8 HOURS AS NEEDED FOR ANXIETY  ? LINZESS 145 MCG CAPS capsule TAKE 1 CAPSULE BY MOUTH ONCE DAILY AS NEEDED  ? methocarbamol (ROBAXIN) 500 MG tablet Take 1 tablet (500 mg total) by mouth every 8 (eight) hours as needed for muscle spasms.  ? venlafaxine XR (EFFEXOR-XR) 75 MG 24 hr capsule TAKE 1 CAPSULE BY MOUTH ONCE DAILY WITH BREAKFAST  ? [DISCONTINUED] lisinopril (ZESTRIL) 5 MG tablet Take 1 tablet (5 mg total) by mouth daily.  ? [DISCONTINUED] cefUROXime (CEFTIN) 500 MG tablet Take 1 tablet (500 mg total) by mouth 2 (two) times daily with a meal.  ? [DISCONTINUED] ciprofloxacin-dexamethasone (CIPRODEX) OTIC suspension Place 4 drops into both ears 2 (two) times daily.  ? ?Facility-Administered Medications Prior to Visit  ?Medication Dose Route Frequency Provider  ? methylPREDNISolone sodium succinate (SOLU-MEDROL) 130 mg in sodium chloride 0.9 % 50 mL IVPB  130 mg Intravenous  Once Lorrene Reid, PA-C  ? ? ?Review of Systems ?Review of Systems:  ?A fourteen system review of systems was performed and found to be positive as per HPI. ? ? ? Objective  ?  ?BP 138/88   Pulse (!) 58   Temp 97.9 ?F (36.6 ?C)   Ht '5\' 8"'$  (1.727 m)   Wt 264 lb (119.7 kg)   SpO2 98%   BMI 40.14 kg/m?  ? ? ?Physical Exam  ? ?General Appearance:    Alert, cooperative, in no acute distress, appears stated age  ?Head:    Normocephalic, without obvious abnormality, atraumatic  ?Eyes:    PERRL, conjunctiva/corneas clear, EOM's intact, fundi  ?  benign, both eyes       ?Ears:    Normal TM's and external ear canals, both ears  ?Nose:   Nares normal, septum midline, mucosa normal, no drainage ?  or sinus tenderness  ?Throat:    Lips, mucosa, and tongue normal; teeth and gums fair  ?Neck:   Supple, symmetrical, trachea midline, no adenopathy;     ?  thyroid:  No enlargement/tenderness/nodules; no JVD  ?Back:     Symmetric, no curvature, ROM normal, no CVA tenderness  ?Lungs:     Clear to auscultation bilaterally, respirations unlabored  ?Chest wall:    No tenderness or deformity  ?Heart:    Bradycardic. Normal rhythm. No murmurs, rubs, or gallops.  S1 and S2 normal  ?Abdomen:     Soft, non-tender, bowel sounds active all four quadrants,  ?  no masses, no organomegaly  ?Genitalia:    deferred  ?Rectal:    deferred  ?Extremities:   All extremities are intact. No cyanosis or edema  ?Pulses:   2+ and symmetric all extremities  ?Skin:   Skin color, texture, turgor normal, no rashes or lesions  ?Lymph nodes:   Cervical and supraclavicular nodes normal  ?Neurologic:   CNII-XII grossly intact  ? ? ? ?Last depression screening scores ?PHQ 2/9 Scores 03/06/2022 10/31/2021 08/23/2021  ?PHQ - 2 Score 0 0 0  ?PHQ- 9 Score 3 3 0  ? ?Last fall risk screening ?Fall Risk  03/06/2022  ?Falls in the past year? 0  ?Number falls in past yr: 0  ?Injury with Fall? 0  ?Risk for fall due to : No Fall Risks  ?Follow up Falls evaluation completed  ? ? ? ?No results found for any visits on 03/06/22. ? Assessment & Plan  ?  ?Routine Health Maintenance and Physical Exam ? ?Exercise Activities and Dietary recommendations ?-Discussed heart healthy diet low in fat and carbohydrates. ? ?Immunization History  ?Administered Date(s) Administered  ? Tdap 10/25/2009  ? ? ?Health Maintenance  ?Topic Date Due  ? COVID-19 Vaccine (1) Never done  ? HIV Screening  Never done  ? Hepatitis C Screening  Never done  ? Zoster Vaccines- Shingrix (1 of 2) Never done  ? TETANUS/TDAP  10/26/2019  ? INFLUENZA VACCINE  03/16/2022 (Originally 07/17/2021)  ? COLONOSCOPY (Pts 45-22yr Insurance coverage will need to be confirmed)  12/21/2029  ? HPV VACCINES  Aged Out  ? ? ?Discussed health benefits of  physical activity, and encouraged him to engage in regular exercise appropriate for his age and condition. ? ?Problem List Items Addressed This Visit   ? ?  ? Cardiovascular and Mediastinum  ? Hypertension  ? Relevant Medications  ? lisinopril (ZESTRIL) 5 MG tablet  ? Other Relevant Orders  ? Comprehensive metabolic panel  ?  ? Other  ?  GAD (generalized anxiety disorder) (Chronic)  ? Hyperlipidemia  ? Relevant Medications  ? lisinopril (ZESTRIL) 5 MG tablet  ? Other Relevant Orders  ? Lipid panel  ? Healthcare maintenance - Primary  ? Relevant Orders  ? TSH  ? Lipid panel  ? Hemoglobin A1c  ? Comprehensive metabolic panel  ? CBC with Differential/Platelet  ? PSA  ? ?Other Visit Diagnoses   ? ? Screening for prostate cancer      ? Relevant Orders  ? PSA  ? Screening for endocrine, nutritional, metabolic and immunity disorder      ? Relevant Orders  ? TSH  ? Lipid panel  ? Hemoglobin A1c  ? Comprehensive metabolic panel  ? CBC with Differential/Platelet  ? Other fatigue      ? Relevant Orders  ? Testosterone,Free and Total  ? Decreased libido      ? Relevant Orders  ? Testosterone,Free and Total  ? ?  ? ?Will collect routine fasting labs and discussed possible out-of-pocket cost for testosterone labs. Pt verbalized understanding and wants to proceed with testosterone labs.  ?Declined Hep C, HIV screenings and immunizations.  ?BP elevated on intake, BP repeated with mild improvement. Pt reports home BP readings have been running from 130-140/80s so will increase Lisinopril to 10 mg. Pt recently filled his rx so advised to start taking 2 tablets of 5 mg and notify the office before next refill so new rx can be sent. Pt verbalized understanding. ?UTD colonoscopy.  ? ?Return in about 4 months (around 07/06/2022), or HLD, HTN, Mood.  ?  ? ? ? ?Lorrene Reid, PA-C  ?[facility] ?[phone] (phone) ?[phone] (fax) ? ?[facility] ?

## 2022-03-06 NOTE — Patient Instructions (Signed)

## 2022-03-11 LAB — CBC WITH DIFFERENTIAL/PLATELET
Basophils Absolute: 0.1 10*3/uL (ref 0.0–0.2)
Basos: 1 %
EOS (ABSOLUTE): 0.5 10*3/uL — ABNORMAL HIGH (ref 0.0–0.4)
Eos: 5 %
Hematocrit: 42.4 % (ref 37.5–51.0)
Hemoglobin: 14.2 g/dL (ref 13.0–17.7)
Immature Grans (Abs): 0.1 10*3/uL (ref 0.0–0.1)
Immature Granulocytes: 1 %
Lymphocytes Absolute: 2.1 10*3/uL (ref 0.7–3.1)
Lymphs: 23 %
MCH: 29.7 pg (ref 26.6–33.0)
MCHC: 33.5 g/dL (ref 31.5–35.7)
MCV: 89 fL (ref 79–97)
Monocytes Absolute: 0.8 10*3/uL (ref 0.1–0.9)
Monocytes: 9 %
Neutrophils Absolute: 5.6 10*3/uL (ref 1.4–7.0)
Neutrophils: 61 %
Platelets: 234 10*3/uL (ref 150–450)
RBC: 4.78 x10E6/uL (ref 4.14–5.80)
RDW: 13 % (ref 11.6–15.4)
WBC: 9.2 10*3/uL (ref 3.4–10.8)

## 2022-03-11 LAB — COMPREHENSIVE METABOLIC PANEL
ALT: 334 IU/L — ABNORMAL HIGH (ref 0–44)
AST: 98 IU/L — ABNORMAL HIGH (ref 0–40)
Albumin/Globulin Ratio: 1.7 (ref 1.2–2.2)
Albumin: 4.3 g/dL (ref 3.8–4.9)
Alkaline Phosphatase: 95 IU/L (ref 44–121)
BUN/Creatinine Ratio: 8 — ABNORMAL LOW (ref 9–20)
BUN: 9 mg/dL (ref 6–24)
Bilirubin Total: 0.4 mg/dL (ref 0.0–1.2)
CO2: 24 mmol/L (ref 20–29)
Calcium: 9.3 mg/dL (ref 8.7–10.2)
Chloride: 105 mmol/L (ref 96–106)
Creatinine, Ser: 1.08 mg/dL (ref 0.76–1.27)
Globulin, Total: 2.6 g/dL (ref 1.5–4.5)
Glucose: 96 mg/dL (ref 70–99)
Potassium: 4.9 mmol/L (ref 3.5–5.2)
Sodium: 143 mmol/L (ref 134–144)
Total Protein: 6.9 g/dL (ref 6.0–8.5)
eGFR: 82 mL/min/{1.73_m2} (ref >59)

## 2022-03-11 LAB — TESTOSTERONE,FREE AND TOTAL
Testosterone, Free: 2.7 pg/mL — ABNORMAL LOW (ref 7.2–24.0)
Testosterone: 166 ng/dL — ABNORMAL LOW (ref 264–916)

## 2022-03-11 LAB — LIPID PANEL
Chol/HDL Ratio: 4.6 ratio (ref 0.0–5.0)
Cholesterol, Total: 171 mg/dL (ref 100–199)
HDL: 37 mg/dL — ABNORMAL LOW (ref >39)
LDL Chol Calc (NIH): 95 mg/dL (ref 0–99)
Triglycerides: 227 mg/dL — ABNORMAL HIGH (ref 0–149)
VLDL Cholesterol Cal: 39 mg/dL (ref 5–40)

## 2022-03-11 LAB — PSA: Prostate Specific Ag, Serum: 1.2 ng/mL (ref 0.0–4.0)

## 2022-03-11 LAB — TSH: TSH: 1.38 u[IU]/mL (ref 0.450–4.500)

## 2022-03-11 LAB — HEMOGLOBIN A1C
Est. average glucose Bld gHb Est-mCnc: 123 mg/dL
Hgb A1c MFr Bld: 5.9 % — ABNORMAL HIGH (ref 4.8–5.6)

## 2022-03-14 ENCOUNTER — Other Ambulatory Visit: Payer: Commercial Managed Care - PPO

## 2022-03-14 ENCOUNTER — Other Ambulatory Visit: Payer: Self-pay

## 2022-03-14 DIAGNOSIS — R748 Abnormal levels of other serum enzymes: Secondary | ICD-10-CM

## 2022-03-15 LAB — COMPREHENSIVE METABOLIC PANEL
ALT: 77 IU/L — ABNORMAL HIGH (ref 0–44)
AST: 29 IU/L (ref 0–40)
Albumin/Globulin Ratio: 2 (ref 1.2–2.2)
Albumin: 4.3 g/dL (ref 3.8–4.9)
Alkaline Phosphatase: 92 IU/L (ref 44–121)
BUN/Creatinine Ratio: 15 (ref 9–20)
BUN: 14 mg/dL (ref 6–24)
Bilirubin Total: 0.4 mg/dL (ref 0.0–1.2)
CO2: 23 mmol/L (ref 20–29)
Calcium: 9.4 mg/dL (ref 8.7–10.2)
Chloride: 104 mmol/L (ref 96–106)
Creatinine, Ser: 0.96 mg/dL (ref 0.76–1.27)
Globulin, Total: 2.2 g/dL (ref 1.5–4.5)
Glucose: 93 mg/dL (ref 70–99)
Potassium: 5 mmol/L (ref 3.5–5.2)
Sodium: 142 mmol/L (ref 134–144)
Total Protein: 6.5 g/dL (ref 6.0–8.5)
eGFR: 95 mL/min/{1.73_m2} (ref >59)

## 2022-04-22 ENCOUNTER — Other Ambulatory Visit: Payer: Self-pay | Admitting: Physician Assistant

## 2022-04-22 DIAGNOSIS — E781 Pure hyperglyceridemia: Secondary | ICD-10-CM

## 2022-04-22 DIAGNOSIS — E785 Hyperlipidemia, unspecified: Secondary | ICD-10-CM

## 2022-05-25 ENCOUNTER — Other Ambulatory Visit: Payer: Self-pay | Admitting: Physician Assistant

## 2022-05-25 DIAGNOSIS — F411 Generalized anxiety disorder: Secondary | ICD-10-CM

## 2022-07-03 ENCOUNTER — Encounter: Payer: Self-pay | Admitting: Physician Assistant

## 2022-07-03 ENCOUNTER — Ambulatory Visit (INDEPENDENT_AMBULATORY_CARE_PROVIDER_SITE_OTHER): Payer: Commercial Managed Care - PPO | Admitting: Physician Assistant

## 2022-07-03 VITALS — BP 151/88 | HR 59 | Temp 97.5°F | Ht 68.0 in | Wt 261.9 lb

## 2022-07-03 DIAGNOSIS — F411 Generalized anxiety disorder: Secondary | ICD-10-CM

## 2022-07-03 DIAGNOSIS — E785 Hyperlipidemia, unspecified: Secondary | ICD-10-CM | POA: Diagnosis not present

## 2022-07-03 DIAGNOSIS — I10 Essential (primary) hypertension: Secondary | ICD-10-CM | POA: Diagnosis not present

## 2022-07-03 DIAGNOSIS — R748 Abnormal levels of other serum enzymes: Secondary | ICD-10-CM

## 2022-07-03 DIAGNOSIS — R3912 Poor urinary stream: Secondary | ICD-10-CM

## 2022-07-03 MED ORDER — LISINOPRIL 10 MG PO TABS: 10.0000 mg | ORAL_TABLET | Freq: Every day | ORAL | 1 refills | Status: DC

## 2022-07-03 NOTE — Assessment & Plan Note (Signed)
-  Last lipid panel: HDL 37,LDL 95 -Patient on atorvastatin 40 mg. Will repeat lipid panel and collect CMP to monitor hepatic function. Follow a low fat diet and encourage a routine exercise regimen. Will continue to monitor.

## 2022-07-03 NOTE — Patient Instructions (Signed)

## 2022-07-03 NOTE — Assessment & Plan Note (Signed)
-  Stable. -Continue current medication regimen.  -Will continue to monitor. 

## 2022-07-03 NOTE — Assessment & Plan Note (Signed)
-  BP elevated likely secondary to being without medication for several weeks. Will send rx for Lisinopril 10 mg daily. Advised to continue to monitor BP at home and if BP readings consistently >135/80 for further medication adjustments. Pt verbalized understanding. Will continue to monitor.

## 2022-07-03 NOTE — Progress Notes (Signed)
Established patient visit   Patient: Garrett Holmes   DOB: 12/11/1966   55 y.o. Male  MRN: [id number] Visit Date: 07/03/2022  Chief Complaint  Patient presents with   Hyperlipidemia   Hypertension   Depression   Subjective    HPI  Patient presents for chronic follow-up. Patient reports continues to have issues with weak urinary stream which varies.  Mood: Patient reports medication compliance with Venlafaxine. Has not needed to take hydroxyzine. States mood has been stable including anxiety.   HTN: Pt denies chest pain, palpitations, dizziness or lower extremity swelling. Taking medication as directed without side effects. Patient's Lisinopril was increased to 10 mg at last visit. Patient reports ran out of medication about a month ago. Has not checked blood pressure at home lately. Denies excessive salt intake.   HLD: Pt taking medication as directed without issues. No myalgias. No changes with diet.       07/03/2022    8:17 AM 03/06/2022    8:32 AM 10/31/2021    8:57 AM 08/23/2021    8:12 AM 08/04/2021   11:11 AM  Depression screen PHQ 2/9  Decreased Interest 0 0 0 0 0  Down, Depressed, Hopeless 0 0 0 0 0  PHQ - 2 Score 0 0 0 0 0  Altered sleeping 1 2 3 0 0  Tired, decreased energy 1 1 0 0 0  Change in appetite 0 0 0 0 0  Feeling bad or failure about yourself  0 0 0 0 0  Trouble concentrating 0 0 0 0 0  Moving slowly or fidgety/restless 0 0 0 0 0  Suicidal thoughts 0 0 0 0 0  PHQ-9 Score 2 3 3 0 0  Difficult doing work/chores Not difficult at all Not difficult at all Not difficult at all        07/03/2022    8:20 AM 03/06/2022    8:32 AM 10/31/2021    8:57 AM 08/04/2021   11:12 AM  GAD 7 : Generalized Anxiety Score  Nervous, Anxious, on Edge 0 0 0 0  Control/stop worrying 0 0 0 0  Worry too much - different things 0 0 0 0  Trouble relaxing 1 0 0 0  Restless 1 0 1 0  Easily annoyed or irritable 1 0 0 0  Afraid - awful might happen 0 0 0 0  Total GAD 7 Score 3 0 1 0   Anxiety Difficulty Not difficult at all Not difficult at all Not difficult at all         Medications: Outpatient Medications Prior to Visit  Medication Sig   atorvastatin (LIPITOR) 40 MG tablet Take 1 tablet by mouth once daily   fluticasone (FLONASE) 50 MCG/ACT nasal spray Place 2 sprays into both nostrils daily.   LINZESS 145 MCG CAPS capsule TAKE 1 CAPSULE BY MOUTH ONCE DAILY AS NEEDED   venlafaxine XR (EFFEXOR-XR) 75 MG 24 hr capsule TAKE 1 CAPSULE BY MOUTH ONCE DAILY WITH BREAKFAST   ALPRAZolam (XANAX) 0.25 MG tablet Take 1 tablet (0.25 mg total) by mouth 2 (two) times daily as needed for anxiety. (Patient not taking: Reported on 07/03/2022)   hydrOXYzine (ATARAX/VISTARIL) 10 MG tablet TAKE 1 TABLET BY MOUTH EVERY 8 HOURS AS NEEDED FOR ANXIETY (Patient not taking: Reported on 07/03/2022)   [DISCONTINUED] gabapentin (NEURONTIN) 100 MG capsule Take 1 capsule by mouth at bedtime (Patient not taking: Reported on 07/03/2022)   [DISCONTINUED] lisinopril (ZESTRIL) 5 MG tablet Take 2 tablets (10 mg   total) by mouth daily.   [DISCONTINUED] methocarbamol (ROBAXIN) 500 MG tablet Take 1 tablet (500 mg total) by mouth every 8 (eight) hours as needed for muscle spasms. (Patient not taking: Reported on 07/03/2022)   Facility-Administered Medications Prior to Visit  Medication Dose Route Frequency Provider   methylPREDNISolone sodium succinate (SOLU-MEDROL) 130 mg in sodium chloride 0.9 % 50 mL IVPB  130 mg Intravenous Once Betty Daidone, PA-C    Review of Systems Review of Systems:  A fourteen system review of systems was performed and found to be positive as per HPI.  Last CBC Lab Results  Component Value Date   WBC 9.2 03/06/2022   HGB 14.2 03/06/2022   HCT 42.4 03/06/2022   MCV 89 03/06/2022   MCH 29.7 03/06/2022   RDW 13.0 03/06/2022   PLT 234 02/63/7858   Last metabolic panel Lab Results  Component Value Date   GLUCOSE 93 03/14/2022   NA 142 03/14/2022   K 5.0 03/14/2022   CL  104 03/14/2022   CO2 23 03/14/2022   BUN 14 03/14/2022   CREATININE 0.96 03/14/2022   EGFR 95 03/14/2022   CALCIUM 9.4 03/14/2022   PROT 6.5 03/14/2022   ALBUMIN 4.3 03/14/2022   LABGLOB 2.2 03/14/2022   AGRATIO 2.0 03/14/2022   BILITOT 0.4 03/14/2022   ALKPHOS 92 03/14/2022   AST 29 03/14/2022   ALT 77 (H) 03/14/2022   Last lipids Lab Results  Component Value Date   CHOL 171 03/06/2022   HDL 37 (L) 03/06/2022   LDLCALC 95 03/06/2022   TRIG 227 (H) 03/06/2022   CHOLHDL 4.6 03/06/2022   Last hemoglobin A1c Lab Results  Component Value Date   HGBA1C 5.9 (H) 03/06/2022   Last thyroid functions Lab Results  Component Value Date   TSH 1.380 03/06/2022     Objective    BP (!) 151/88   Pulse (!) 59   Temp (!) 97.5 F (36.4 C) (Oral)   Ht 5' 8" (1.727 m)   Wt 261 lb 14.4 oz (118.8 kg)   SpO2 96% Comment: on RA  BMI 39.82 kg/m  BP Readings from Last 3 Encounters:  07/03/22 (!) 151/88  03/06/22 138/88  10/31/21 (!) 150/90   Wt Readings from Last 3 Encounters:  07/03/22 261 lb 14.4 oz (118.8 kg)  03/06/22 264 lb (119.7 kg)  10/31/21 264 lb (119.7 kg)    Physical Exam  General:  Pleasant and cooperative, appropriate for stated age.  Neuro:  Alert and oriented,  extra-ocular muscles intact  HEENT:  Normocephalic, atraumatic, neck supple  Skin:  no gross rash, warm, pink. Cardiac:  RRR, S1 S2 Respiratory: CTA B/L  Vascular:  Ext warm, no cyanosis apprec.; cap RF less 2 sec. Psych:  No HI/SI, judgement and insight good, Euthymic mood. Full Affect.   No results found for any visits on 07/03/22.  Assessment & Plan      Problem List Items Addressed This Visit       Cardiovascular and Mediastinum   Hypertension    -BP elevated likely secondary to being without medication for several weeks. Will send rx for Lisinopril 10 mg daily. Advised to continue to monitor BP at home and if BP readings consistently >135/80 for further medication adjustments. Pt verbalized  understanding. Will continue to monitor.      Relevant Medications   lisinopril (ZESTRIL) 10 MG tablet   Other Relevant Orders   Comp Met (CMET)     Other   GAD (generalized anxiety  disorder) (Chronic)    -Stable. -Continue current medication regimen. -Will continue to monitor.      Hyperlipidemia - Primary    -Last lipid panel: HDL 37,LDL 95 -Patient on atorvastatin 40 mg. Will repeat lipid panel and collect CMP to monitor hepatic function. Follow a low fat diet and encourage a routine exercise regimen. Will continue to monitor.      Relevant Medications   lisinopril (ZESTRIL) 10 MG tablet   Other Relevant Orders   Lipid Profile   Other Visit Diagnoses     Elevated liver enzymes       Relevant Orders   US Abdomen Limited RUQ (LIVER/GB)   Comp Met (CMET)   Weak urinary stream           Elevated liver enzymes: -Last AST 29, ALT 77 -Will repeat hepatic function. Discussed with patient various etiologies including medications, alcohol use, fatty liver. Will collect liver ultrasound for further evaluation. Will repeat liver function.  Weak urinary stream: -Discussed referral to urology. Patient will let me know if decides to pursue referral. Last PSA 1.2 (03/06/22).  Return in about 4 months (around 11/03/2022) for HTN, mood, HLD.        Maritza Abonza, PA-C  [facility] [phone] (phone) [phone] (fax)  [facility] 

## 2022-07-04 LAB — COMPREHENSIVE METABOLIC PANEL
ALT: 34 IU/L (ref 0–44)
AST: 23 IU/L (ref 0–40)
Albumin/Globulin Ratio: 2 (ref 1.2–2.2)
Albumin: 4.4 g/dL (ref 3.8–4.9)
Alkaline Phosphatase: 88 IU/L (ref 44–121)
BUN/Creatinine Ratio: 11 (ref 9–20)
BUN: 12 mg/dL (ref 6–24)
Bilirubin Total: 0.5 mg/dL (ref 0.0–1.2)
CO2: 23 mmol/L (ref 20–29)
Calcium: 9.4 mg/dL (ref 8.7–10.2)
Chloride: 101 mmol/L (ref 96–106)
Creatinine, Ser: 1.08 mg/dL (ref 0.76–1.27)
Globulin, Total: 2.2 g/dL (ref 1.5–4.5)
Glucose: 92 mg/dL (ref 70–99)
Potassium: 4.5 mmol/L (ref 3.5–5.2)
Sodium: 141 mmol/L (ref 134–144)
Total Protein: 6.6 g/dL (ref 6.0–8.5)
eGFR: 82 mL/min/{1.73_m2} (ref >59)

## 2022-07-04 LAB — LIPID PANEL
Chol/HDL Ratio: 4.2 ratio (ref 0.0–5.0)
Cholesterol, Total: 178 mg/dL (ref 100–199)
HDL: 42 mg/dL (ref >39)
LDL Chol Calc (NIH): 86 mg/dL (ref 0–99)
Triglycerides: 302 mg/dL — ABNORMAL HIGH (ref 0–149)
VLDL Cholesterol Cal: 50 mg/dL — ABNORMAL HIGH (ref 5–40)

## 2022-07-06 ENCOUNTER — Other Ambulatory Visit: Payer: Commercial Managed Care - PPO

## 2022-07-09 ENCOUNTER — Ambulatory Visit
Admission: RE | Admit: 2022-07-09 | Discharge: 2022-07-09 | Disposition: A | Discharge status: Home / Self Care | Payer: Commercial Managed Care - PPO | Source: Ambulatory Visit | Attending: Physician Assistant | Admitting: Physician Assistant

## 2022-07-09 ENCOUNTER — Other Ambulatory Visit: Payer: Self-pay | Admitting: Physician Assistant

## 2022-07-09 DIAGNOSIS — R748 Abnormal levels of other serum enzymes: Secondary | ICD-10-CM

## 2022-07-20 ENCOUNTER — Other Ambulatory Visit: Payer: Self-pay | Admitting: Physician Assistant

## 2022-07-20 DIAGNOSIS — E785 Hyperlipidemia, unspecified: Secondary | ICD-10-CM

## 2022-07-20 DIAGNOSIS — E781 Pure hyperglyceridemia: Secondary | ICD-10-CM

## 2022-08-05 ENCOUNTER — Other Ambulatory Visit: Payer: Self-pay | Admitting: Physician Assistant

## 2022-08-05 DIAGNOSIS — K5900 Constipation, unspecified: Secondary | ICD-10-CM

## 2022-10-08 ENCOUNTER — Other Ambulatory Visit: Payer: Self-pay | Admitting: Physician Assistant

## 2022-10-08 DIAGNOSIS — F411 Generalized anxiety disorder: Secondary | ICD-10-CM

## 2022-11-02 ENCOUNTER — Encounter: Payer: Self-pay | Admitting: Physician Assistant

## 2022-11-02 ENCOUNTER — Ambulatory Visit: Payer: Commercial Managed Care - PPO | Admitting: Physician Assistant

## 2022-11-02 VITALS — BP 148/91 | HR 64 | Resp 18 | Ht 68.0 in | Wt 263.0 lb

## 2022-11-02 DIAGNOSIS — K760 Fatty (change of) liver, not elsewhere classified: Secondary | ICD-10-CM

## 2022-11-02 DIAGNOSIS — I10 Essential (primary) hypertension: Secondary | ICD-10-CM | POA: Diagnosis not present

## 2022-11-02 DIAGNOSIS — E785 Hyperlipidemia, unspecified: Secondary | ICD-10-CM | POA: Diagnosis not present

## 2022-11-02 DIAGNOSIS — F411 Generalized anxiety disorder: Secondary | ICD-10-CM | POA: Diagnosis not present

## 2022-11-02 MED ORDER — LISINOPRIL 20 MG PO TABS: 20.0000 mg | ORAL_TABLET | Freq: Every day | ORAL | 1 refills | Status: DC

## 2022-11-02 NOTE — Assessment & Plan Note (Addendum)
-  Stable. Continue Venlafaxine XR 75 mg daily and hydroxyzine 10 mg as needed for severe anxiety.

## 2022-11-02 NOTE — Patient Instructions (Addendum)
Fatty Liver Disease  The liver converts food into energy, removes toxic material from the blood, makes important proteins, and absorbs necessary vitamins from food. Fatty liver disease occurs when too much fat has built up in your liver cells. Fatty liver disease is also called hepatic steatosis. In many cases, fatty liver disease does not cause symptoms or problems. It is often diagnosed when tests are being done for other reasons. However, over time, fatty liver can cause inflammation that may lead to more serious liver problems, such as scarring of the liver (cirrhosis) and liver failure. Fatty liver is associated with insulin resistance, increased body fat, high blood pressure (hypertension), and high cholesterol. These are features of metabolic syndrome and increase your risk for stroke, diabetes, and heart disease. What are the causes? This condition may be caused by components of metabolic syndrome: Obesity. Insulin resistance. High cholesterol. Other causes: Alcohol abuse. Poor nutrition. Cushing syndrome. Pregnancy. Certain drugs. Poisons. Some viral infections. What increases the risk? You are more likely to develop this condition if you: Abuse alcohol. Are overweight. Have diabetes. Have hepatitis. Have a high triglyceride level. Are pregnant. What are the signs or symptoms? Fatty liver disease often does not cause symptoms. If symptoms do develop, they can include: Fatigue and weakness. Weight loss. Confusion. Nausea, vomiting, or abdominal pain. Yellowing of your skin and the white parts of your eyes (jaundice). Itchy skin. How is this diagnosed? This condition may be diagnosed by: A physical exam and your medical history. Blood tests. Imaging tests, such as an ultrasound, CT scan, or MRI. A liver biopsy. A small sample of liver tissue is removed using a needle. The sample is then looked at under a microscope. How is this treated? Fatty liver disease is often  caused by other health conditions. Treatment for fatty liver may involve medicines and lifestyle changes to manage conditions such as: Alcoholism. High cholesterol. Diabetes. Being overweight or obese. Follow these instructions at home:  Do not drink alcohol. If you have trouble quitting, ask your health care provider how to safely quit with the help of medicine or a supervised program. This is important to keep your condition from getting worse. Eat a healthy diet as told by your health care provider. Ask your health care provider about working with a dietitian to develop an eating plan. Exercise regularly. This can help you lose weight and control your cholesterol and diabetes. Talk to your health care provider about an exercise plan and which activities are best for you. Take over-the-counter and prescription medicines only as told by your health care provider. Keep all follow-up visits. This is important. Contact a health care provider if: You have trouble controlling your: Blood sugar. This is especially important if you have diabetes. Cholesterol. Drinking of alcohol. Get help right away if: You have abdominal pain. You have jaundice. You have nausea and are vomiting. You vomit blood or material that looks like coffee grounds. You have stools that are black, tar-like, or bloody. Summary Fatty liver disease develops when too much fat builds up in the cells of your liver. Fatty liver disease often causes no symptoms or problems. However, over time, fatty liver can cause inflammation that may lead to more serious liver problems, such as scarring of the liver (cirrhosis). You are more likely to develop this condition if you abuse alcohol, are pregnant, are overweight, have diabetes, have hepatitis, or have high triglyceride or cholesterol levels. Contact your health care provider if you have trouble controlling your blood   sugar, cholesterol, or drinking of alcohol. This information is  not intended to replace advice given to you by your health care provider. Make sure you discuss any questions you have with your health care provider. Document Revised: 09/15/2020 Document Reviewed: 09/15/2020 Elsevier Patient Education  [place].   Hypertension, Adult Hypertension is another name for high blood pressure. High blood pressure forces your heart to work harder to pump blood. This can cause problems over time. There are two numbers in a blood pressure reading. There is a top number (systolic) over a bottom number (diastolic). It is best to have a blood pressure that is below 120/80. What are the causes? The cause of this condition is not known. Some other conditions can lead to high blood pressure. What increases the risk? Some lifestyle factors can make you more likely to develop high blood pressure: Smoking. Not getting enough exercise or physical activity. Being overweight. Having too much fat, sugar, calories, or salt (sodium) in your diet. Drinking too much alcohol. Other risk factors include: Having any of these conditions: Heart disease. Diabetes. High cholesterol. Kidney disease. Obstructive sleep apnea. Having a family history of high blood pressure and high cholesterol. Age. The risk increases with age. Stress. What are the signs or symptoms? High blood pressure may not cause symptoms. Very high blood pressure (hypertensive crisis) may cause: Headache. Fast or uneven heartbeats (palpitations). Shortness of breath. Nosebleed. Vomiting or feeling like you may vomit (nauseous). Changes in how you see. Very bad chest pain. Feeling dizzy. Seizures. How is this treated? This condition is treated by making healthy lifestyle changes, such as: Eating healthy foods. Exercising more. Drinking less alcohol. Your doctor may prescribe medicine if lifestyle changes do not help enough and if: Your top number is above 130. Your bottom number is above  80. Your personal target blood pressure may vary. Follow these instructions at home: Eating and drinking  If told, follow the DASH eating plan. To follow this plan: Fill one half of your plate at each meal with fruits and vegetables. Fill one fourth of your plate at each meal with whole grains. Whole grains include whole-wheat pasta, brown rice, and whole-grain bread. Eat or drink low-fat dairy products, such as skim milk or low-fat yogurt. Fill one fourth of your plate at each meal with low-fat (lean) proteins. Low-fat proteins include fish, chicken without skin, eggs, beans, and tofu. Avoid fatty meat, cured and processed meat, or chicken with skin. Avoid pre-made or processed food. Limit the amount of salt in your diet to less than 1,500 mg each day. Do not drink alcohol if: Your doctor tells you not to drink. You are pregnant, may be pregnant, or are planning to become pregnant. If you drink alcohol: Limit how much you have to: 0-1 drink a day for women. 0-2 drinks a day for men. Know how much alcohol is in your drink. In the [place] one drink equals one 12 oz bottle of beer (355 mL), one 5 oz glass of wine (148 mL), or one 1 oz glass of hard liquor (44 mL). Lifestyle  Work with your doctor to stay at a healthy weight or to lose weight. Ask your doctor what the best weight is for you. Get at least 30 minutes of exercise that causes your heart to beat faster (aerobic exercise) most days of the week. This may include walking, swimming, or biking. Get at least 30 minutes of exercise that strengthens your muscles (resistance exercise) at least 3 days  a week. This may include lifting weights or doing Pilates. Do not smoke or use any products that contain nicotine or tobacco. If you need help quitting, ask your doctor. Check your blood pressure at home as told by your doctor. Keep all follow-up visits. Medicines Take over-the-counter and prescription medicines only as told by your doctor.  Follow directions carefully. Do not skip doses of blood pressure medicine. The medicine does not work as well if you skip doses. Skipping doses also puts you at risk for problems. Ask your doctor about side effects or reactions to medicines that you should watch for. Contact a doctor if: You think you are having a reaction to the medicine you are taking. You have headaches that keep coming back. You feel dizzy. You have swelling in your ankles. You have trouble with your vision. Get help right away if: You get a very bad headache. You start to feel mixed up (confused). You feel weak or numb. You feel faint. You have very bad pain in your: Chest. Belly (abdomen). You vomit more than once. You have trouble breathing. These symptoms may be an emergency. Get help right away. Call 911. Do not wait to see if the symptoms will go away. Do not drive yourself to the hospital. Summary Hypertension is another name for high blood pressure. High blood pressure forces your heart to work harder to pump blood. For most people, a normal blood pressure is less than 120/80. Making healthy choices can help lower blood pressure. If your blood pressure does not get lower with healthy choices, you may need to take medicine. This information is not intended to replace advice given to you by your health care provider. Make sure you discuss any questions you have with your health care provider. Document Revised: 09/21/2021 Document Reviewed: 09/21/2021 Elsevier Patient Education  [place].

## 2022-11-02 NOTE — Assessment & Plan Note (Signed)
-  BP remains elevated and patient has not checked BP at home so will increased Lisinopril to 20 mg daily. Advised can take 2 tablets of current Lisinopril 10 mg and then start new rx. Pt verbalized understanding. Will collect CMP to monitor renal function and electrolytes.

## 2022-11-02 NOTE — Progress Notes (Signed)
Established patient visit   Patient: Garrett Holmes   DOB: [birth year]   56 y.o. Male  MRN: [id number] Visit Date: 11/02/2022  Chief Complaint  Patient presents with   Follow-up    Non fasting    Hypertension   Hyperlipidemia   Anxiety   Subjective    HPI HPI     Follow-up    Additional comments: Non fasting       Last edited by Gemma Payor, CMA on 11/02/2022  9:21 AM.      Patient presents for chronic follow-up. Patient reports has not seen gastroenterology yet. Drinking about 6-7 beers per week.  HTN: Pt denies chest pain, palpitations, dizziness, headache or lower extremity swelling. Taking medication as directed without side effects. Has not checked blood pressure at home.  HLD: Pt taking medication as directed without issues. States no changes with his diet.  Mood: Reports mood has been stable. States rarely takes hydroxyzine.      11/02/2022    9:22 AM 07/03/2022    8:17 AM 03/06/2022    8:32 AM 10/31/2021    8:57 AM 08/23/2021    8:12 AM  Depression screen PHQ 2/9  Decreased Interest 0 0 0 0 0  Down, Depressed, Hopeless 0 0 0 0 0  PHQ - 2 Score 0 0 0 0 0  Altered sleeping _0 0  Tired, decreased energy 0 1 1 0 0  Change in appetite 0 0 0 0 0  Feeling bad or failure about yourself  0 0 0 0 0  Trouble concentrating 0 0 0 0 0  Moving slowly or fidgety/restless 0 0 0 0 0  Suicidal thoughts 0 0 0 0 0  PHQ-9 Score _1 0  Difficult doing work/chores Not difficult at all Not difficult at all Not difficult at all Not difficult at all       11/02/2022    9:22 AM 07/03/2022    8:20 AM 03/06/2022    8:32 AM 10/31/2021    8:57 AM  GAD 7 : Generalized Anxiety Score  Nervous, Anxious, on Edge 0 0 0 0  Control/stop worrying 0 0 0 0  Worry too much - different things 0 0 0 0  Trouble relaxing 0 1 0 0  Restless 0 1 0 1  Easily annoyed or irritable  1 0 0  Afraid - awful might happen 0 0 0 0  Total GAD 7 Score  3 0 1  Anxiety Difficulty Not difficult at  all Not difficult at all Not difficult at all Not difficult at all      Medications: Outpatient Medications Prior to Visit  Medication Sig   ALPRAZolam (XANAX) 0.25 MG tablet Take 1 tablet (0.25 mg total) by mouth 2 (two) times daily as needed for anxiety.   atorvastatin (LIPITOR) 40 MG tablet Take 1 tablet by mouth once daily   fluticasone (FLONASE) 50 MCG/ACT nasal spray Place 2 sprays into both nostrils daily.   hydrOXYzine (ATARAX/VISTARIL) 10 MG tablet TAKE 1 TABLET BY MOUTH EVERY 8 HOURS AS NEEDED FOR ANXIETY   LINZESS 145 MCG CAPS capsule TAKE 1 CAPSULE BY MOUTH ONCE DAILY AS NEEDED   venlafaxine XR (EFFEXOR-XR) 75 MG 24 hr capsule TAKE 1 CAPSULE BY MOUTH ONCE DAILY WITH BREAKFAST   [DISCONTINUED] lisinopril (ZESTRIL) 10 MG tablet Take 1 tablet (10 mg total) by mouth daily.   Facility-Administered Medications Prior to Visit  Medication Dose Route Frequency Provider  methylPREDNISolone sodium succinate (SOLU-MEDROL) 130 mg in sodium chloride 0.9 % 50 mL IVPB  130 mg Intravenous Once Marcellius Montagna, PA-C    Review of Systems Review of Systems:  A fourteen system review of systems was performed and found to be positive as per HPI.  Last CBC Lab Results  Component Value Date   WBC 9.2 03/06/2022   HGB 14.2 03/06/2022   HCT 42.4 03/06/2022   MCV 89 03/06/2022   MCH 29.7 03/06/2022   RDW 13.0 03/06/2022   PLT 234 15/37/9432   Last metabolic panel Lab Results  Component Value Date   GLUCOSE 92 07/03/2022   NA 141 07/03/2022   K 4.5 07/03/2022   CL 101 07/03/2022   CO2 23 07/03/2022   BUN 12 07/03/2022   CREATININE 1.08 07/03/2022   EGFR 82 07/03/2022   CALCIUM 9.4 07/03/2022   PROT 6.6 07/03/2022   ALBUMIN 4.4 07/03/2022   LABGLOB 2.2 07/03/2022   AGRATIO 2.0 07/03/2022   BILITOT 0.5 07/03/2022   ALKPHOS 88 07/03/2022   AST 23 07/03/2022   ALT 34 07/03/2022   Last lipids Lab Results  Component Value Date   CHOL 178 07/03/2022   HDL 42 07/03/2022    LDLCALC 86 07/03/2022   TRIG 302 (H) 07/03/2022   CHOLHDL 4.2 07/03/2022   Last hemoglobin A1c Lab Results  Component Value Date   HGBA1C 5.9 (H) 03/06/2022   Last thyroid functions Lab Results  Component Value Date   TSH 1.380 03/06/2022       Objective    BP (!) 148/91 (BP Location: Left Arm, Patient Position: Sitting, Cuff Size: Large)   Pulse 64   Resp 18   Ht _0  (1.727 m)   Wt 263 lb (119.3 kg)   SpO2 98%   BMI 39.99 kg/m  BP Readings from Last 3 Encounters:  11/02/22 (!) 148/91  07/03/22 (!) 151/88  03/06/22 138/88   Wt Readings from Last 3 Encounters:  11/02/22 263 lb (119.3 kg)  07/03/22 261 lb 14.4 oz (118.8 kg)  03/06/22 264 lb (119.7 kg)    Physical Exam  General:  Cooperative, in no acute distress, appropriate for stated age.  Neuro:  Alert and oriented,  extra-ocular muscles intact  HEENT:  Normocephalic, atraumatic, neck supple  Skin:  no gross rash, warm, pink. Cardiac:  RRR, S1 S2 Respiratory: CTA B/L w/o wheezing or rhonchi. Vascular:  Ext warm, no cyanosis apprec.; cap RF less 2 sec. Psych:  No HI/SI, judgement and insight good, Euthymic mood. Full Affect.   No results found for any visits on 11/02/22.  Assessment & Plan      Problem List Items Addressed This Visit       Cardiovascular and Mediastinum   Hypertension    -BP remains elevated and patient has not checked BP at home so will increased Lisinopril to 20 mg daily. Advised can take 2 tablets of current Lisinopril 10 mg and then start new rx. Pt verbalized understanding. Will collect CMP to monitor renal function and electrolytes.      Relevant Medications   lisinopril (ZESTRIL) 20 MG tablet   Other Relevant Orders   Comp Met (CMET)     Other   GAD (generalized anxiety disorder) (Chronic)    -Stable. Continue Venlafaxine XR 75 mg daily and hydroxyzine 10 mg as needed for severe anxiety.       Hyperlipidemia - Primary    -Last lipid panel: HDL 42, LDL 86. Recommend to  continue  atorvastatin 40 mg daily. Recommend repeating lipid panel and hepatic function with annual physical.       Relevant Medications   lisinopril (ZESTRIL) 20 MG tablet   Other Visit Diagnoses     Hepatic steatosis          Hepatic steatosis: -Re-emphasized patient the importance to follow-up with his gastroenterologist. Pt declined referral when contacted about his ultrasound results. Recommend to avoid alcohol use and fatty foods. Encourage weight loss with diet changes. -CLINICAL DATA:  elevated liver enzymes  EXAM: ULTRASOUND ABDOMEN LIMITED RIGHT UPPER QUADRANT   COMPARISON:  None Available.   FINDINGS: Gallbladder:   No gallstones or wall thickening visualized. No sonographic Murphy sign noted by sonographer.   Common bile duct:   Diameter: Visualized portion measures 2 mm, within normal limits   Liver:   No focal lesion identified. Diffusely increased and heterogeneous parenchymal echogenicity with poor acoustic penetrance. Query mildly nodular contours and hypertrophy of the LEFT liver. Portal vein is patent on color Doppler imaging with normal direction of blood flow towards the liver. Other: None. IMPRESSION: Hepatic steatosis. Possible morphologic changes could reflect underlying fibrosis.  Return in about 18 weeks (around 03/08/2023) for CPE and FBW.        Lorrene Reid, PA-C  [facility] at [facility] [phone] (phone) [phone] (fax)  [place]

## 2022-11-02 NOTE — Assessment & Plan Note (Signed)
-  Last lipid panel: HDL 42, LDL 86. Recommend to continue atorvastatin 40 mg daily. Recommend repeating lipid panel and hepatic function with annual physical.

## 2022-11-03 LAB — COMPREHENSIVE METABOLIC PANEL
ALT: 37 IU/L (ref 0–44)
AST: 23 IU/L (ref 0–40)
Albumin/Globulin Ratio: 1.8 (ref 1.2–2.2)
Albumin: 4.3 g/dL (ref 3.8–4.9)
Alkaline Phosphatase: 81 IU/L (ref 44–121)
BUN/Creatinine Ratio: 13 (ref 9–20)
BUN: 13 mg/dL (ref 6–24)
Bilirubin Total: 0.4 mg/dL (ref 0.0–1.2)
CO2: 25 mmol/L (ref 20–29)
Calcium: 9.7 mg/dL (ref 8.7–10.2)
Chloride: 104 mmol/L (ref 96–106)
Creatinine, Ser: 1.02 mg/dL (ref 0.76–1.27)
Globulin, Total: 2.4 g/dL (ref 1.5–4.5)
Glucose: 89 mg/dL (ref 70–99)
Potassium: 5.2 mmol/L (ref 3.5–5.2)
Sodium: 142 mmol/L (ref 134–144)
Total Protein: 6.7 g/dL (ref 6.0–8.5)
eGFR: 87 mL/min/{1.73_m2} (ref >59)

## 2022-11-28 ENCOUNTER — Other Ambulatory Visit: Payer: Self-pay | Admitting: Nurse Practitioner

## 2022-11-28 DIAGNOSIS — G2581 Restless legs syndrome: Secondary | ICD-10-CM

## 2022-11-28 DIAGNOSIS — E785 Hyperlipidemia, unspecified: Secondary | ICD-10-CM

## 2022-11-28 DIAGNOSIS — E781 Pure hyperglyceridemia: Secondary | ICD-10-CM

## 2022-11-28 MED ORDER — GABAPENTIN 100 MG PO CAPS: 100.0000 mg | ORAL_CAPSULE | Freq: Every day | ORAL | 0 refills | Status: DC

## 2022-11-28 MED ORDER — ATORVASTATIN CALCIUM 40 MG PO TABS: 40.0000 mg | ORAL_TABLET | Freq: Every day | ORAL | 0 refills | Status: DC

## 2023-01-01 ENCOUNTER — Other Ambulatory Visit: Payer: Self-pay

## 2023-01-01 DIAGNOSIS — K5900 Constipation, unspecified: Secondary | ICD-10-CM

## 2023-01-01 MED ORDER — LINACLOTIDE 145 MCG PO CAPS: 145.0000 ug | ORAL_CAPSULE | Freq: Every day | ORAL | 0 refills | Status: DC | PRN

## 2023-01-01 NOTE — Telephone Encounter (Signed)
L.O.V: 11/02/22  N.O.V: 03/05/23  L.R.F: 08/06/22 90 cap 0 refill

## 2023-01-30 ENCOUNTER — Other Ambulatory Visit: Payer: Self-pay

## 2023-01-30 DIAGNOSIS — F411 Generalized anxiety disorder: Secondary | ICD-10-CM

## 2023-01-30 MED ORDER — VENLAFAXINE HCL ER 75 MG PO CP24: 75.0000 mg | ORAL_CAPSULE | Freq: Every day | ORAL | 0 refills | Status: DC

## 2023-02-13 ENCOUNTER — Other Ambulatory Visit: Payer: Self-pay | Admitting: Nurse Practitioner

## 2023-02-13 DIAGNOSIS — E781 Pure hyperglyceridemia: Secondary | ICD-10-CM

## 2023-02-13 DIAGNOSIS — E785 Hyperlipidemia, unspecified: Secondary | ICD-10-CM

## 2023-02-24 ENCOUNTER — Other Ambulatory Visit: Payer: Self-pay | Admitting: Nurse Practitioner

## 2023-02-24 DIAGNOSIS — G2581 Restless legs syndrome: Secondary | ICD-10-CM

## 2023-02-27 ENCOUNTER — Other Ambulatory Visit: Payer: Self-pay | Admitting: Nurse Practitioner

## 2023-02-27 DIAGNOSIS — E785 Hyperlipidemia, unspecified: Secondary | ICD-10-CM

## 2023-02-27 DIAGNOSIS — E781 Pure hyperglyceridemia: Secondary | ICD-10-CM

## 2023-03-05 ENCOUNTER — Ambulatory Visit (INDEPENDENT_AMBULATORY_CARE_PROVIDER_SITE_OTHER): Payer: Commercial Managed Care - PPO | Admitting: Family Medicine

## 2023-03-05 ENCOUNTER — Encounter: Payer: Self-pay | Admitting: Family Medicine

## 2023-03-05 VITALS — BP 161/104 | HR 74 | Resp 18 | Ht 68.0 in | Wt 263.0 lb

## 2023-03-05 DIAGNOSIS — E669 Obesity, unspecified: Secondary | ICD-10-CM

## 2023-03-05 DIAGNOSIS — E781 Pure hyperglyceridemia: Secondary | ICD-10-CM

## 2023-03-05 DIAGNOSIS — F411 Generalized anxiety disorder: Secondary | ICD-10-CM

## 2023-03-05 DIAGNOSIS — E785 Hyperlipidemia, unspecified: Secondary | ICD-10-CM

## 2023-03-05 DIAGNOSIS — Z23 Encounter for immunization: Secondary | ICD-10-CM

## 2023-03-05 DIAGNOSIS — I10 Essential (primary) hypertension: Secondary | ICD-10-CM

## 2023-03-05 DIAGNOSIS — G4733 Obstructive sleep apnea (adult) (pediatric): Secondary | ICD-10-CM | POA: Diagnosis not present

## 2023-03-05 DIAGNOSIS — R0609 Other forms of dyspnea: Secondary | ICD-10-CM

## 2023-03-05 DIAGNOSIS — Z0000 Encounter for general adult medical examination without abnormal findings: Secondary | ICD-10-CM | POA: Diagnosis not present

## 2023-03-05 DIAGNOSIS — R350 Frequency of micturition: Secondary | ICD-10-CM

## 2023-03-05 MED ORDER — HYDROXYZINE HCL 10 MG PO TABS: 10.0000 mg | ORAL_TABLET | Freq: Three times a day (TID) | ORAL | 1 refills | Status: DC | PRN

## 2023-03-05 MED ORDER — LISINOPRIL 40 MG PO TABS: 40.0000 mg | ORAL_TABLET | Freq: Every day | ORAL | 3 refills | Status: DC

## 2023-03-05 MED ORDER — VENLAFAXINE HCL ER 75 MG PO CP24: 75.0000 mg | ORAL_CAPSULE | Freq: Every day | ORAL | 0 refills | Status: DC

## 2023-03-05 MED ORDER — ATORVASTATIN CALCIUM 40 MG PO TABS: 40.0000 mg | ORAL_TABLET | Freq: Every day | ORAL | 0 refills | Status: DC

## 2023-03-05 NOTE — Assessment & Plan Note (Signed)
Compliant with nightly CPAP machine.

## 2023-03-05 NOTE — Assessment & Plan Note (Addendum)
Blood pressure has been elevated at home and is elevated in clinic, increase lisinopril to 40 mg daily.  Collecting CMP for electrolytes and renal function today.  Encouraged low-sodium diet and regular exercise, continue ambulatory BP monitoring.  Will continue to monitor. With the recent dyspnea on exertion, it is also reasonable to get an echocardiogram to check for any decrease in the ejection fraction.  Patient is agreeable to this plan.

## 2023-03-05 NOTE — Assessment & Plan Note (Addendum)
Stable, GAD-7 score of 0.  Continue Effexor 75 mg daily and hydroxyzine 10 mg as needed for breakthrough anxiety.  Will continue to monitor.

## 2023-03-05 NOTE — Progress Notes (Signed)
Complete physical exam  Patient: Garrett Holmes   DOB: 1965-12-29   57 y.o. Male  MRN: [id number]  Subjective:    Chief Complaint  Patient presents with   Annual Exam    Garrett Holmes is a 57 y.o. male who presents today for a complete physical exam. He reports consuming a general diet. The patient does not participate in regular exercise at present. He generally feels well. He reports sleeping poorly, he has trouble staying asleep and wakes up after 4 to 6 hours.  He has been taking over-the-counter medications including Benadryl to help him sleep through the night.  He has also been experiencing urinary frequency and dribbling for about a month.  PSA 03/06/2022 was within normal limits.  Additionally, he endorses high blood pressures when he checks them at home with some dyspnea on exertion for the past several months.   Most recent fall risk assessment:    03/05/2023    8:15 AM  Fall Risk   Falls in the past year? 0  Number falls in past yr: 0  Injury with Fall? 0  Risk for fall due to : No Fall Risks     Most recent depression screenings:    03/05/2023    8:15 AM 11/02/2022    9:22 AM  PHQ 2/9 Scores  PHQ - 2 Score 0 0  PHQ- 9 Score  3      11/02/2022    9:22 AM 07/03/2022    8:20 AM 03/06/2022    8:32 AM 10/31/2021    8:57 AM  GAD 7 : Generalized Anxiety Score  Nervous, Anxious, on Edge 0 0 0 0  Control/stop worrying 0 0 0 0  Worry too much - different things 0 0 0 0  Trouble relaxing 0 1 0 0  Restless 0 1 0 1  Easily annoyed or irritable  1 0 0  Afraid - awful might happen 0 0 0 0  Total GAD 7 Score  3 0 1  Anxiety Difficulty Not difficult at all Not difficult at all Not difficult at all Not difficult at all   Patient Active Problem List   Diagnosis Date Noted   Chronic swimmer's ear of both sides 08/23/2021   Thrombosed hemorrhoids 04/05/2021   Hypertriglyceridemia >er 500 02/23/2020   Excessive drinking alcohol on Fri & Sat 02/23/2020   Hypertension  10/12/2019   GAD (generalized anxiety disorder) 09/14/2019   OSA (obstructive sleep apnea) 05/04/2018   Snoring 05/04/2018   Former smoker 03/13/2016   Non-seasonal allergic rhinitis 03/13/2016   Hyperlipidemia 05/22/2011   Obesity (BMI 30-39.9) 05/22/2011    Past Surgical History:  Procedure Laterality Date   COLONOSCOPY  2012   HUNG   Social History   Tobacco Use   Smoking status: Former    Types: Cigarettes    Quit date: 12/17/2010    Years since quitting: 12.2    Passive exposure: Never   Smokeless tobacco: Never  Vaping Use   Vaping Use: Never used  Substance Use Topics   Alcohol use: Yes    Alcohol/week: 1.0 standard drink of alcohol    Types: 1 Cans of beer per week   Drug use: Not Currently    Types: Morphine   Family History  Problem Relation Age of Onset   Cancer Mother 52       Renal cancer   Allergies  Allergen Reactions   Sudafed [Pseudoephedrine Hcl]     Wires him up, agitation  Patient Care Team: Velva Harman, PA as PCP - General (Family Medicine) Lavonna Monarch, MD (Inactive) as Consulting Physician (Dermatology)   Outpatient Medications Prior to Visit  Medication Sig   ALPRAZolam (XANAX) 0.25 MG tablet Take 1 tablet (0.25 mg total) by mouth 2 (two) times daily as needed for anxiety.   fluticasone (FLONASE) 50 MCG/ACT nasal spray Place 2 sprays into both nostrils daily.   gabapentin (NEURONTIN) 100 MG capsule Take 1 capsule by mouth at bedtime   linaclotide (LINZESS) 145 MCG CAPS capsule Take 1 capsule (145 mcg total) by mouth daily as needed.   [DISCONTINUED] atorvastatin (LIPITOR) 40 MG tablet Take 1 tablet (40 mg total) by mouth daily.   [DISCONTINUED] hydrOXYzine (ATARAX/VISTARIL) 10 MG tablet TAKE 1 TABLET BY MOUTH EVERY 8 HOURS AS NEEDED FOR ANXIETY   [DISCONTINUED] lisinopril (ZESTRIL) 20 MG tablet Take 1 tablet (20 mg total) by mouth daily.   [DISCONTINUED] venlafaxine XR (EFFEXOR-XR) 75 MG 24 hr capsule Take 1 capsule (75 mg  total) by mouth daily with breakfast.   Facility-Administered Medications Prior to Visit  Medication Dose Route Frequency Provider   methylPREDNISolone sodium succinate (SOLU-MEDROL) 130 mg in sodium chloride 0.9 % 50 mL IVPB  130 mg Intravenous Once Abonza, Maritza, PA-C    Review of Systems  Constitutional:  Negative for chills, fever and malaise/fatigue.  HENT:  Negative for congestion and hearing loss.   Eyes:  Negative for blurred vision and double vision.  Respiratory:  Positive for shortness of breath. Negative for cough.   Cardiovascular:  Negative for chest pain, palpitations and leg swelling.  Gastrointestinal:  Positive for constipation (Controlled with Linzess). Negative for abdominal pain, diarrhea and heartburn.  Genitourinary:  Negative for frequency and urgency.  Musculoskeletal:  Negative for myalgias and neck pain.  Neurological:  Negative for headaches.  Endo/Heme/Allergies:  Negative for polydipsia.  Psychiatric/Behavioral:  Negative for depression. The patient is not nervous/anxious.        Objective:    BP (!) 161/104 (BP Location: Left Arm, Patient Position: Sitting, Cuff Size: Large)   Pulse 74   Resp 18   Ht 5\' 8"  (1.727 m)   Wt 263 lb (119.3 kg)   SpO2 97%   BMI 39.99 kg/m    Physical Exam Constitutional:      General: He is not in acute distress.    Appearance: Normal appearance.  HENT:     Head: Normocephalic and atraumatic.     Right Ear: Tympanic membrane and ear canal normal.     Left Ear: Tympanic membrane and ear canal normal.     Nose: Nose normal.     Mouth/Throat:     Mouth: Mucous membranes are moist.     Pharynx: Oropharynx is clear.  Eyes:     Pupils: Pupils are equal, round, and reactive to light.  Neck:     Thyroid: No thyromegaly or thyroid tenderness.  Cardiovascular:     Rate and Rhythm: Normal rate and regular rhythm.     Pulses: Normal pulses.     Heart sounds: Normal heart sounds. No murmur heard.    No friction rub.  No gallop.  Pulmonary:     Effort: Pulmonary effort is normal.     Breath sounds: Normal breath sounds.  Abdominal:     General: Bowel sounds are normal.     Palpations: Abdomen is soft.  Musculoskeletal:        General: Normal range of motion.     Cervical back:  Normal range of motion and neck supple.  Lymphadenopathy:     Cervical: No cervical adenopathy.  Skin:    General: Skin is warm and dry.  Neurological:     Mental Status: He is alert and oriented to person, place, and time.  Psychiatric:        Mood and Affect: Mood normal.       Assessment & Plan:    Routine Health Maintenance and Physical Exam  Immunization History  Administered Date(s) Administered   Moderna Sars-Covid-2 Vaccination 03/31/2020, 04/28/2020   Tdap 10/25/2009, 03/05/2023    Health Maintenance  Topic Date Due   HIV Screening  Never done   Hepatitis C Screening  Never done   Zoster Vaccines- Shingrix (1 of 2) Never done   COVID-19 Vaccine (3 - Moderna risk series) 05/26/2020   INFLUENZA VACCINE  03/17/2023 (Originally 07/17/2022)   COLONOSCOPY (Pts 45-67yrs Insurance coverage will need to be confirmed)  12/21/2029   DTaP/Tdap/Td (3 - Td or Tdap) 03/04/2033   HPV VACCINES  Aged Out   Declined HIV and hepatitis C screening. Agreeable to Tdap vaccine today.  Declined flu, COVID boosters, shingles shots.  Discussed health benefits of physical activity, and encouraged him to engage in regular exercise appropriate for his age and condition.  Wellness examination -     CBC with Differential/Platelet; Future -     Comprehensive metabolic panel; Future -     Hemoglobin A1c; Future -     Lipid panel; Future -     TSH; Future -     PSA; Future  Hypertension, unspecified type Assessment & Plan: Blood pressure has been elevated at home and is elevated in clinic, increase lisinopril to 40 mg daily.  Collecting CMP for electrolytes and renal function today.  Encouraged low-sodium diet and regular  exercise, continue ambulatory BP monitoring.  Will continue to monitor. With the recent dyspnea on exertion, it is also reasonable to get an echocardiogram to check for any decrease in the ejection fraction.  Patient is agreeable to this plan.  Orders: -     CBC with Differential/Platelet; Future -     Comprehensive metabolic panel; Future -     Lisinopril; Take 1 tablet (40 mg total) by mouth daily.  Dispense: 90 tablet; Refill: 3  OSA (obstructive sleep apnea) Assessment & Plan: Compliant with nightly CPAP machine.   GAD (generalized anxiety disorder) Assessment & Plan: Stable, GAD-7 score of 0.  Continue Effexor 75 mg daily and hydroxyzine 10 mg as needed for breakthrough anxiety.  Will continue to monitor.  Orders: -     hydrOXYzine HCl; Take 1 tablet (10 mg total) by mouth every 8 (eight) hours as needed. for anxiety  Dispense: 30 tablet; Refill: 1 -     Venlafaxine HCl ER; Take 1 capsule (75 mg total) by mouth daily with breakfast.  Dispense: 60 capsule; Refill: 0  Hyperlipidemia, unspecified hyperlipidemia type Assessment & Plan: Last lipid panel: LDL 86, HDL 42, triglycerides 302.  Continue atorvastatin 40 mg daily.  If lab results warrant necessary change, we discussed the possibility of increasing the atorvastatin or switching to rosuvastatin if he would prefer that.  Patient verbalized understanding and is agreeable to this plan.  Orders: -     Lipid panel; Future -     Atorvastatin Calcium; Take 1 tablet (40 mg total) by mouth daily.  Dispense: 90 tablet; Refill: 0  Dyspnea on exertion -     ECHOCARDIOGRAM COMPLETE; Future  Frequency of  urination -     Ambulatory referral to Urology  Hypertriglyceridemia  Assessment & Plan: Last triglycerides 302, elevated and increased from prior.  Recommend to reduce simple carbohydrates and reduce alcohol.  Will continue to monitor.  As symptoms in the future, it may also be reasonable to consider adding on  fenofibrate.  Orders: -     Atorvastatin Calcium; Take 1 tablet (40 mg total) by mouth daily.  Dispense: 90 tablet; Refill: 0  Need for Tdap vaccination -     Tdap vaccine greater than or equal to 7yo IM  Obesity (BMI 30-39.9) Assessment & Plan: Patient would like to lose weight but finds it difficult to make dietary changes.  We discussed following a heart healthy diet and limiting saturated/trans fats and simple carbohydrates.  Recommended trying to follow 80/20 approach to begin.  We also discussed medication options or other resources like a nutritionist or healthy weight and wellness clinic.  Patient will contact insurance to inquire about weight loss medication coverage.  We will discuss at his follow-up appointment in 3 months.   Return in about 4 months (around 07/05/2023) for follow-up.   Velva Harman, PA

## 2023-03-05 NOTE — Assessment & Plan Note (Addendum)
>>  ASSESSMENT AND PLAN FOR HYPERLIPIDEMIA WRITTEN ON 03/05/2023  8:50 AM BY Bowker Weed A, PA  Last lipid panel: LDL 86, HDL 42, triglycerides 302.  Continue atorvastatin 40 mg daily.  If lab results warrant necessary change, we discussed the possibility of increasing the atorvastatin or switching to rosuvastatin if he would prefer that.  Patient verbalized understanding and is agreeable to this plan.  >>ASSESSMENT AND PLAN FOR HYPERTRIGLYCERIDEMIA >ER 500 WRITTEN ON 03/05/2023  8:51 AM BY Govanni Plemons A, PA  Last triglycerides 302, elevated and increased from prior.  Recommend to reduce simple carbohydrates and reduce alcohol.  Will continue to monitor.  As symptoms in the future, it may also be reasonable to consider adding on fenofibrate.

## 2023-03-05 NOTE — Patient Instructions (Addendum)
Increase your lisinopril to 40 mg a day.  You can take 220 mg tablets to finish the bottle that you currently have, and I sent in a new prescription for 40 mg tablets for you to start once you finish your current bottle.  You can increase your gabapentin to see if it helps with your symptoms as well as sleep.  You can take up to 3 of your 100 mg tablets an hour or 2 before bed.  If this is helpful, then I can send in a prescription for 300 mg tablets to simplify your routine.  Please let me know if you have not heard from urology in the next week or so and I will send him a reminder to give you a call.  He will also be getting a call to schedule the echocardiogram to make sure that your heart is still functioning normally given your recent difficulty breathing when exerting yourself.  Is nice to meet you today, I will see you at your next appointment in 3 months!

## 2023-03-05 NOTE — Assessment & Plan Note (Signed)
Patient would like to lose weight but finds it difficult to make dietary changes.  We discussed following a heart healthy diet and limiting saturated/trans fats and simple carbohydrates.  Recommended trying to follow 80/20 approach to begin.  We also discussed medication options or other resources like a nutritionist or healthy weight and wellness clinic.  Patient will contact insurance to inquire about weight loss medication coverage.  We will discuss at his follow-up appointment in 3 months.

## 2023-03-05 NOTE — Assessment & Plan Note (Signed)
Last triglycerides 302, elevated and increased from prior.  Recommend to reduce simple carbohydrates and reduce alcohol.  Will continue to monitor.  As symptoms in the future, it may also be reasonable to consider adding on fenofibrate.

## 2023-03-06 LAB — CBC WITH DIFFERENTIAL/PLATELET
Basophils Absolute: 0.1 10*3/uL (ref 0.0–0.2)
Basos: 1 %
EOS (ABSOLUTE): 0.3 10*3/uL (ref 0.0–0.4)
Eos: 3 %
Hematocrit: 42.8 % (ref 37.5–51.0)
Hemoglobin: 14.2 g/dL (ref 13.0–17.7)
Immature Grans (Abs): 0.1 10*3/uL (ref 0.0–0.1)
Immature Granulocytes: 1 %
Lymphocytes Absolute: 2.3 10*3/uL (ref 0.7–3.1)
Lymphs: 22 %
MCH: 29.6 pg (ref 26.6–33.0)
MCHC: 33.2 g/dL (ref 31.5–35.7)
MCV: 89 fL (ref 79–97)
Monocytes Absolute: 0.8 10*3/uL (ref 0.1–0.9)
Monocytes: 8 %
Neutrophils Absolute: 6.7 10*3/uL (ref 1.4–7.0)
Neutrophils: 65 %
Platelets: 212 10*3/uL (ref 150–450)
RBC: 4.79 x10E6/uL (ref 4.14–5.80)
RDW: 13.2 % (ref 11.6–15.4)
WBC: 10.2 10*3/uL (ref 3.4–10.8)

## 2023-03-06 LAB — LIPID PANEL
Chol/HDL Ratio: 4.8 ratio (ref 0.0–5.0)
Cholesterol, Total: 190 mg/dL (ref 100–199)
HDL: 40 mg/dL (ref >39)
LDL Chol Calc (NIH): 75 mg/dL (ref 0–99)
Triglycerides: 475 mg/dL — ABNORMAL HIGH (ref 0–149)
VLDL Cholesterol Cal: 75 mg/dL — ABNORMAL HIGH (ref 5–40)

## 2023-03-06 LAB — COMPREHENSIVE METABOLIC PANEL
ALT: 44 IU/L (ref 0–44)
AST: 31 IU/L (ref 0–40)
Albumin/Globulin Ratio: 1.6 (ref 1.2–2.2)
Albumin: 4.3 g/dL (ref 3.8–4.9)
Alkaline Phosphatase: 110 IU/L (ref 44–121)
BUN/Creatinine Ratio: 15 (ref 9–20)
BUN: 15 mg/dL (ref 6–24)
Bilirubin Total: 0.5 mg/dL (ref 0.0–1.2)
CO2: 19 mmol/L — ABNORMAL LOW (ref 20–29)
Calcium: 9.3 mg/dL (ref 8.7–10.2)
Chloride: 102 mmol/L (ref 96–106)
Creatinine, Ser: 0.99 mg/dL (ref 0.76–1.27)
Globulin, Total: 2.7 g/dL (ref 1.5–4.5)
Glucose: 90 mg/dL (ref 70–99)
Potassium: 4.6 mmol/L (ref 3.5–5.2)
Sodium: 138 mmol/L (ref 134–144)
Total Protein: 7 g/dL (ref 6.0–8.5)
eGFR: 91 mL/min/{1.73_m2} (ref >59)

## 2023-03-06 LAB — HEMOGLOBIN A1C
Est. average glucose Bld gHb Est-mCnc: 123 mg/dL
Hgb A1c MFr Bld: 5.9 % — ABNORMAL HIGH (ref 4.8–5.6)

## 2023-03-06 LAB — TSH: TSH: 1.38 u[IU]/mL (ref 0.450–4.500)

## 2023-03-11 ENCOUNTER — Other Ambulatory Visit: Payer: Self-pay | Admitting: Family Medicine

## 2023-03-11 ENCOUNTER — Encounter: Payer: Self-pay | Admitting: Family Medicine

## 2023-03-11 DIAGNOSIS — E781 Pure hyperglyceridemia: Secondary | ICD-10-CM

## 2023-03-11 MED ORDER — FENOFIBRATE 50 MG PO CAPS: 50.0000 mg | ORAL_CAPSULE | Freq: Every day | ORAL | 2 refills | Status: DC

## 2023-03-22 ENCOUNTER — Other Ambulatory Visit: Payer: Self-pay | Admitting: Nurse Practitioner

## 2023-03-22 ENCOUNTER — Other Ambulatory Visit: Payer: Self-pay | Admitting: Family Medicine

## 2023-03-22 DIAGNOSIS — K5900 Constipation, unspecified: Secondary | ICD-10-CM

## 2023-03-22 DIAGNOSIS — G2581 Restless legs syndrome: Secondary | ICD-10-CM

## 2023-03-27 ENCOUNTER — Encounter: Payer: Self-pay | Admitting: Urology

## 2023-03-27 ENCOUNTER — Ambulatory Visit: Payer: Commercial Managed Care - PPO | Admitting: Urology

## 2023-03-27 VITALS — BP 167/97 | HR 65 | Ht 68.0 in | Wt 260.0 lb

## 2023-03-27 DIAGNOSIS — R350 Frequency of micturition: Secondary | ICD-10-CM

## 2023-03-27 DIAGNOSIS — R6882 Decreased libido: Secondary | ICD-10-CM

## 2023-03-27 DIAGNOSIS — E291 Testicular hypofunction: Secondary | ICD-10-CM

## 2023-03-27 LAB — URINALYSIS, COMPLETE
Bilirubin, UA: NEGATIVE
Ketones, UA: NEGATIVE
Leukocytes,UA: NEGATIVE
Nitrite, UA: NEGATIVE
Protein,UA: NEGATIVE
RBC, UA: NEGATIVE
Specific Gravity, UA: 1.01 (ref 1.005–1.030)
Urobilinogen, Ur: 0.2 mg/dL (ref 0.2–1.0)
pH, UA: 6 (ref 5.0–7.5)

## 2023-03-27 LAB — MICROSCOPIC EXAMINATION
Bacteria, UA: NONE SEEN
Epithelial Cells (non renal): NONE SEEN /hpf (ref 0–10)
RBC, Urine: NONE SEEN /hpf (ref 0–2)

## 2023-03-27 LAB — BLADDER SCAN AMB NON-IMAGING: Scan Result: 27

## 2023-03-27 MED ORDER — TAMSULOSIN HCL 0.4 MG PO CAPS: 0.4000 mg | ORAL_CAPSULE | Freq: Every day | ORAL | 1 refills | Status: DC

## 2023-03-27 NOTE — Progress Notes (Signed)
Garrett Holmes,acting as a scribe for Garrett Altes, MD.,have documented all relevant documentation on the behalf of Garrett Altes, MD,as directed by  Garrett Altes, MD while in the presence of Garrett Altes, MD.   03/27/2023 6:19 PM   Garrett Holmes 11-28-1966 [id number]  Referring provider: Melida Quitter, PA [address] [place],  [place] [address]  Chief Complaint  Patient presents with   Urinary Frequency    HPI: Garrett Holmes is a 57 y.o. male who presents today for evaluation of urinary frequency.  Reports a 1 month history of urinary frequency urgency, hesitancy, weak urinary stream, and sensation of incomplete emptying IPSS of 20/35, with the most bothersome symptoms being urgency and weak stream He has also been seen for significant tiredness and fatigue.  Noted to have a testosterone level of 166 when tested a year ago. Testosterone replacement was not recommended since he was on Effexor.   PMH: Past Medical History:  Diagnosis Date   Allergy    Anxiety    Depression    Dyslipidemia    Hyperlipidemia     Surgical History: Past Surgical History:  Procedure Laterality Date   COLONOSCOPY  2012   HUNG    Home Medications:  Allergies as of 03/27/2023       Reactions   Sudafed [pseudoephedrine Hcl]    Wires him up, agitation        Medication List        Accurate as of March 27, 2023  6:19 PM. If you have any questions, ask your nurse or doctor.          STOP taking these medications    Fenofibrate 50 MG Caps Stopped by: Garrett Altes, MD   fluticasone 50 MCG/ACT nasal spray Commonly known as: FLONASE Stopped by: Garrett Altes, MD       TAKE these medications    ALPRAZolam 0.25 MG tablet Commonly known as: XANAX Take 1 tablet (0.25 mg total) by mouth 2 (two) times daily as needed for anxiety.   atorvastatin 40 MG tablet Commonly known as: LIPITOR Take 1 tablet (40 mg total) by mouth daily.   gabapentin  100 MG capsule Commonly known as: NEURONTIN Take 1 capsule by mouth at bedtime   hydrOXYzine 10 MG tablet Commonly known as: ATARAX Take 1 tablet (10 mg total) by mouth every 8 (eight) hours as needed. for anxiety   Linzess 145 MCG Caps capsule Generic drug: linaclotide TAKE 1 CAPSULE BY MOUTH ONCE DAILY AS NEEDED   lisinopril 40 MG tablet Commonly known as: ZESTRIL Take 1 tablet (40 mg total) by mouth daily.   tamsulosin 0.4 MG Caps capsule Commonly known as: FLOMAX Take 1 capsule (0.4 mg total) by mouth daily. Started by: Garrett Altes, MD   venlafaxine XR 75 MG 24 hr capsule Commonly known as: EFFEXOR-XR Take 1 capsule (75 mg total) by mouth daily with breakfast.        Allergies:  Allergies  Allergen Reactions   Sudafed [Pseudoephedrine Hcl]     Wires him up, agitation    Family History: Family History  Problem Relation Age of Onset   Cancer Mother 87       Renal cancer    Social History:  reports that he quit smoking about 12 years ago. His smoking use included cigarettes. He has never been exposed to tobacco smoke. He has never used smokeless tobacco. He reports current alcohol  use of about 1.0 standard drink of alcohol per week. He reports that he does not currently use drugs after having used the following drugs: Morphine.   Physical Exam: BP (!) 167/97   Pulse 65   Ht 5\' 8"  (1.727 m)   Wt 260 lb (117.9 kg)   BMI 39.53 kg/m   Constitutional:  Alert and oriented, No acute distress. HEENT: [place] AT, moist mucus membranes.  Trachea midline, no masses. Cardiovascular: No clubbing, cyanosis, or edema. Respiratory: Normal respiratory effort, no increased work of breathing. GI: Abdomen is soft, nontender, nondistended, no abdominal masses GU: 30 gram prostate. Smooth with no nodules.  Skin: No rashes, bruises or suspicious lesions. Neurologic: Grossly intact, no focal deficits, moving all 4 extremities. Psychiatric: Normal mood and affect.  Laboratory  data:  PVR today was 27 mL.  Assessment & Plan:    1. Lower urinary tract symptoms We discussed possible etiologies including increased alpha adrenergic bladder neck tone. His symptoms are bothersome enough that he desires a trial of medical management and tamsulosin was sent to the pharmacy. Follow up in 1 month for symptom reassessment.  2. Hypogonadism Does have significant tiredness and fatigue Not aware of any contraindications to TRT and Effexor, however will check Schedule repeat AM testosterone level and [facility]   [facility] [address], [address] [place], [place] [address] [phone]

## 2023-03-28 ENCOUNTER — Encounter: Payer: Self-pay | Admitting: Urology

## 2023-04-02 ENCOUNTER — Encounter (HOSPITAL_COMMUNITY): Payer: Self-pay

## 2023-04-02 ENCOUNTER — Ambulatory Visit (HOSPITAL_COMMUNITY): Payer: Commercial Managed Care - PPO

## 2023-04-10 ENCOUNTER — Other Ambulatory Visit: Payer: Commercial Managed Care - PPO

## 2023-04-10 DIAGNOSIS — E291 Testicular hypofunction: Secondary | ICD-10-CM

## 2023-04-11 LAB — TESTOSTERONE: Testosterone: 180 ng/dL — ABNORMAL LOW (ref 264–916)

## 2023-04-11 LAB — LUTEINIZING HORMONE: LH: 4.1 m[IU]/mL (ref 1.7–8.6)

## 2023-04-29 ENCOUNTER — Encounter: Payer: Self-pay | Admitting: Urology

## 2023-04-29 ENCOUNTER — Ambulatory Visit (INDEPENDENT_AMBULATORY_CARE_PROVIDER_SITE_OTHER): Payer: Commercial Managed Care - PPO | Admitting: Urology

## 2023-04-29 VITALS — BP 160/92 | HR 83 | Ht 68.0 in | Wt 270.0 lb

## 2023-04-29 DIAGNOSIS — E291 Testicular hypofunction: Secondary | ICD-10-CM

## 2023-04-29 DIAGNOSIS — R399 Unspecified symptoms and signs involving the genitourinary system: Secondary | ICD-10-CM | POA: Diagnosis not present

## 2023-04-29 MED ORDER — CLOMIPHENE CITRATE 50 MG PO TABS: ORAL_TABLET | ORAL | 0 refills | Status: DC

## 2023-04-29 MED ORDER — TAMSULOSIN HCL 0.4 MG PO CAPS: 0.4000 mg | ORAL_CAPSULE | Freq: Every day | ORAL | 3 refills | Status: DC

## 2023-04-29 NOTE — Progress Notes (Signed)
I, Garrett Holmes,acting as a scribe for Garrett Altes, MD.,have documented all relevant documentation on the behalf of Garrett Altes, MD,as directed by  Garrett Altes, MD while in the presence of Garrett Altes, MD.  04/29/2023 3:23 PM   Garrett Holmes 02-01-1966 [id number]  Referring provider: Melida Quitter, PA [address] [place],  [place] [address]  Chief Complaint  Patient presents with   Other   Urologic history : 1. Lower urinary tract symptoms His symptoms were bothersome enough that he desired a trial of medical management  Trial of Tamsulosin   2. Hypogonadism Does have significant tiredness and fatigue  HPI: Garrett Holmes is a 57 y.o. male here for 1 month follow up.  Interval history Started on Tamsulosin at last visit and has noted marked improvement in his voiding pattern.  Currently satisfied with his voiding pattern.  His repeat testosterone level was low at 180 with a normal LH of 4.1.   PMH: Past Medical History:  Diagnosis Date   Allergy    Anxiety    Depression    Dyslipidemia    Hyperlipidemia     Surgical History: Past Surgical History:  Procedure Laterality Date   COLONOSCOPY  2012   HUNG    Home Medications:  Allergies as of 04/29/2023       Reactions   Sudafed [pseudoephedrine Hcl]    Wires him up, agitation        Medication List        Accurate as of Apr 29, 2023  3:23 PM. If you have any questions, ask your nurse or doctor.          ALPRAZolam 0.25 MG tablet Commonly known as: XANAX Take 1 tablet (0.25 mg total) by mouth 2 (two) times daily as needed for anxiety.   atorvastatin 40 MG tablet Commonly known as: LIPITOR Take 1 tablet (40 mg total) by mouth daily.   clomiPHENE 50 MG tablet Commonly known as: CLOMID Take 1/2 a tab daily. Started by: Garrett Altes, MD   gabapentin 100 MG capsule Commonly known as: NEURONTIN Take 1 capsule by mouth at bedtime   hydrOXYzine 10 MG  tablet Commonly known as: ATARAX Take 1 tablet (10 mg total) by mouth every 8 (eight) hours as needed. for anxiety   Linzess 145 MCG Caps capsule Generic drug: linaclotide TAKE 1 CAPSULE BY MOUTH ONCE DAILY AS NEEDED   lisinopril 40 MG tablet Commonly known as: ZESTRIL Take 1 tablet (40 mg total) by mouth daily.   tamsulosin 0.4 MG Caps capsule Commonly known as: FLOMAX Take 1 capsule (0.4 mg total) by mouth daily.   venlafaxine XR 75 MG 24 hr capsule Commonly known as: EFFEXOR-XR Take 1 capsule (75 mg total) by mouth daily with breakfast.        Allergies:  Allergies  Allergen Reactions   Sudafed [Pseudoephedrine Hcl]     Wires him up, agitation    Family History: Family History  Problem Relation Age of Onset   Cancer Mother 74       Renal cancer    Social History:  reports that he quit smoking about 12 years ago. His smoking use included cigarettes. He has never been exposed to tobacco smoke. He has never used smokeless tobacco. He reports current alcohol use of about 1.0 standard drink of alcohol per week. He reports that he does not currently use drugs after having used the following drugs:  Morphine.   Physical Exam: BP (!) 160/92   Pulse 83   Ht 5\' 8"  (1.727 m)   Wt 270 lb (122.5 kg)   BMI 41.05 kg/m   Constitutional:  Alert and oriented, No acute distress. HEENT: [place] AT, moist mucus membranes.  Trachea midline, no masses. Cardiovascular: No clubbing, cyanosis, or edema. Respiratory: Normal respiratory effort, no increased work of breathing. GI: Abdomen is soft, nontender, nondistended, no abdominal masses Skin: No rashes, bruises or suspicious lesions. Neurologic: Grossly intact, no focal deficits, moving all 4 extremities. Psychiatric: Normal mood and affect.   Assessment & Plan:    1. Lower urinary tract symptoms Significant improvement on Tamsulosin, which he desires to continue.  Rx sent to the pharmacy  2. Hypogonadism Positive symptoms of  tiredness, fatigue. LH level is normal and we discussed off lable use of Clomid  We discussed the diagnosis of testosterone is based on 2 abnormal total testosterone levels drawn in the a.m. admitted with signs and symptoms of low testosterone. We discussed various forms of testosterone placement including topical preparations, intramuscular injections, subcutaneous injections, subcutaneous pellet implantation and oral testosterone.  Pros and cons of each form were discussed.  The risk of transference of topical testosterone. I had an extensive discussion regarding testosterone replacement therapy including the following: Treatment may result in improvements in erectile function, low sex drive, anemia, bone mineral density, lean body mass, and depressive symptoms; evidence is inconclusive whether testosterone therapy improves cognitive function, measures of diabetes, energy, fatigue, lipid profiles, and quality of life measures; there is no conclusive evidence linking testosterone therapy to the development of prostate cancer; there is no definitive evidence linking testosterone therapy to a higher incidence of venothrombolic events; at the present time it cannot be stated definitively whether testosterone therapy increases or decreases the risk of cardiovascular events including myocardial infarction and stroke. Potential side effects were discussed including erythrocytosis, gynecomastia.  The need for regular monitoring of testosterone levels and hematocrit was discussed.  He does desire a 3 month trial of Clomid Rx sent to pharmacy, 25 mg daily.  6 week follow-up lab visit for testosterone level  [facility] [address] [place], [place] [address] [phone]

## 2023-05-02 ENCOUNTER — Encounter: Payer: Self-pay | Admitting: Urology

## 2023-05-19 ENCOUNTER — Other Ambulatory Visit: Payer: Self-pay | Admitting: Family Medicine

## 2023-05-19 DIAGNOSIS — F411 Generalized anxiety disorder: Secondary | ICD-10-CM

## 2023-05-25 ENCOUNTER — Other Ambulatory Visit: Payer: Self-pay | Admitting: Family Medicine

## 2023-05-25 DIAGNOSIS — E781 Pure hyperglyceridemia: Secondary | ICD-10-CM

## 2023-05-25 DIAGNOSIS — E785 Hyperlipidemia, unspecified: Secondary | ICD-10-CM

## 2023-06-10 ENCOUNTER — Other Ambulatory Visit: Payer: Self-pay

## 2023-06-10 DIAGNOSIS — E291 Testicular hypofunction: Secondary | ICD-10-CM

## 2023-06-11 ENCOUNTER — Other Ambulatory Visit: Payer: Commercial Managed Care - PPO

## 2023-06-11 DIAGNOSIS — E291 Testicular hypofunction: Secondary | ICD-10-CM

## 2023-06-12 ENCOUNTER — Encounter: Payer: Self-pay | Admitting: Urology

## 2023-06-12 LAB — TESTOSTERONE: Testosterone: 417 ng/dL (ref 264–916)

## 2023-06-13 ENCOUNTER — Other Ambulatory Visit: Payer: Self-pay | Admitting: Family Medicine

## 2023-06-13 DIAGNOSIS — E781 Pure hyperglyceridemia: Secondary | ICD-10-CM

## 2023-06-13 DIAGNOSIS — E785 Hyperlipidemia, unspecified: Secondary | ICD-10-CM

## 2023-07-09 ENCOUNTER — Encounter: Payer: Self-pay | Admitting: Family Medicine

## 2023-07-09 ENCOUNTER — Ambulatory Visit: Payer: Commercial Managed Care - PPO | Admitting: Family Medicine

## 2023-07-09 VITALS — BP 131/76 | HR 60 | Temp 97.3°F | Ht 68.0 in | Wt 271.2 lb

## 2023-07-09 DIAGNOSIS — R0609 Other forms of dyspnea: Secondary | ICD-10-CM | POA: Diagnosis not present

## 2023-07-09 DIAGNOSIS — E782 Mixed hyperlipidemia: Secondary | ICD-10-CM

## 2023-07-09 DIAGNOSIS — I10 Essential (primary) hypertension: Secondary | ICD-10-CM

## 2023-07-09 NOTE — Progress Notes (Signed)
Established Patient Office Visit  Subjective   Patient ID: Garrett Holmes, male    DOB: Aug 07, 1966  Age: 57 y.o. MRN: [id number]  Chief Complaint  Patient presents with   Hypertension    HPI Garrett Holmes is a 57 y.o. male presenting today for follow up of hypertension, hyperlipidemia. He did not start the gabapentin for RLS because it said to avoid alcohol and he drinks heavily on the weekends. Hypertension: At last appointment, blood pressure was elevated and lisinopril was increased to 40 mg daily.  He is not exercising and is not adherent to low salt diet.   Pt denies chest pain, SOB, dizziness, edema, syncope, fatigue or heart palpitations. Taking lisinopril, reports excellent compliance with treatment. Denies side effects. He takes his medicine at bedtime each night. Hyperlipidemia: tolerating atorvastatin well with no myalgias or significant side effects. The 10-year ASCVD risk score (Arnett DK, et al., 2019) is: 7.1%  ROS Negative unless otherwise noted in HPI   Objective:     BP 131/76   Pulse 60   Temp (!) 97.3 F (36.3 C) (Oral)   Ht 5\' 8"  (1.727 m)   Wt 271 lb 4 oz (123 kg)   BMI 41.24 kg/m   Physical Exam Constitutional:      General: He is not in acute distress.    Appearance: Normal appearance.  HENT:     Head: Normocephalic and atraumatic.  Cardiovascular:     Rate and Rhythm: Normal rate and regular rhythm.     Heart sounds: Normal heart sounds. No murmur heard.    No friction rub. No gallop.  Pulmonary:     Effort: Pulmonary effort is normal. No respiratory distress.     Breath sounds: Normal breath sounds. No wheezing, rhonchi or rales.     Comments: No decreased breath sounds Skin:    General: Skin is warm and dry.  Neurological:     Mental Status: He is alert and oriented to person, place, and time.  Psychiatric:        Mood and Affect: Mood normal.      Assessment & Plan:  Primary hypertension Assessment & Plan: Blood pressure has been  elevated at home and is elevated in clinic, but has come down since the increase of lisinopril to 40 mg daily.  Switching lisinopril dose to the morning to take advantage of peak effect ~6 hours and half life of 12 hours during waking hours. Continue ambulatory blood pressure monitoring. Will continue to monitor.  With the recent dyspnea on exertion, ordered echocardiogram to check for any decrease in the ejection fraction but patient could not afford the $3500 out of pocket. Ordering BNP today instead to evaluate for HF initially.  Orders: -     CBC with Differential/Platelet; Future -     Comprehensive metabolic panel; Future  Mixed hyperlipidemia Assessment & Plan: Last lipid panel: LDL 75, HDL 40, triglycerides 475.  Continue atorvastatin 40 mg daily. Repeating lipid panel today, but discussed that if triglycerides remain elevated we will need to trial adding fenofibrate to reduce risk of pancreatitis. Patient verbalized understanding and is agreeable to this plan. We will also further discuss his drinking habits and how they also affect triglyceride levels and increase risk of pancreatitis.   DOE (dyspnea on exertion) -     Brain natriuretic peptide; Future    Return in about 4 months (around 11/09/2023) for follow-up for HTN, HLD, fasting blood work 1 week before.  Melida Quitter, PA

## 2023-07-09 NOTE — Assessment & Plan Note (Signed)
Last lipid panel: LDL 75, HDL 40, triglycerides 475.  Continue atorvastatin 40 mg daily. Repeating lipid panel today, but discussed that if triglycerides remain elevated we will need to trial adding fenofibrate to reduce risk of pancreatitis. Patient verbalized understanding and is agreeable to this plan. We will also further discuss his drinking habits and how they also affect triglyceride levels and increase risk of pancreatitis.

## 2023-07-09 NOTE — Assessment & Plan Note (Signed)
Blood pressure has been elevated at home and is elevated in clinic, but has come down since the increase of lisinopril to 40 mg daily.  Switching lisinopril dose to the morning to take advantage of peak effect ~6 hours and half life of 12 hours during waking hours. Continue ambulatory blood pressure monitoring. Will continue to monitor.  With the recent dyspnea on exertion, ordered echocardiogram to check for any decrease in the ejection fraction but patient could not afford the $3500 out of pocket. Ordering BNP today instead to evaluate for HF initially.

## 2023-07-09 NOTE — Patient Instructions (Signed)
Switch your lisinopril to the morning and continue checking blood pressure at home.

## 2023-07-10 LAB — CBC WITH DIFFERENTIAL/PLATELET
Basophils Absolute: 0.1 10*3/uL (ref 0.0–0.2)
Basos: 1 %
EOS (ABSOLUTE): 0.2 10*3/uL (ref 0.0–0.4)
Eos: 2 %
Hematocrit: 42.5 % (ref 37.5–51.0)
Hemoglobin: 14.2 g/dL (ref 13.0–17.7)
Immature Grans (Abs): 0.1 10*3/uL (ref 0.0–0.1)
Immature Granulocytes: 1 %
Lymphocytes Absolute: 2.2 10*3/uL (ref 0.7–3.1)
Lymphs: 21 %
MCH: 30.4 pg (ref 26.6–33.0)
MCHC: 33.4 g/dL (ref 31.5–35.7)
MCV: 91 fL (ref 79–97)
Monocytes Absolute: 0.9 10*3/uL (ref 0.1–0.9)
Monocytes: 9 %
Neutrophils Absolute: 6.9 10*3/uL (ref 1.4–7.0)
Neutrophils: 66 %
Platelets: 212 10*3/uL (ref 150–450)
RBC: 4.67 x10E6/uL (ref 4.14–5.80)
RDW: 13.1 % (ref 11.6–15.4)
WBC: 10.3 10*3/uL (ref 3.4–10.8)

## 2023-07-10 LAB — COMPREHENSIVE METABOLIC PANEL
ALT: 29 IU/L (ref 0–44)
AST: 26 IU/L (ref 0–40)
Albumin: 4.1 g/dL (ref 3.8–4.9)
Alkaline Phosphatase: 76 IU/L (ref 44–121)
BUN/Creatinine Ratio: 13 (ref 9–20)
BUN: 14 mg/dL (ref 6–24)
Bilirubin Total: 0.4 mg/dL (ref 0.0–1.2)
CO2: 22 mmol/L (ref 20–29)
Calcium: 9 mg/dL (ref 8.7–10.2)
Chloride: 103 mmol/L (ref 96–106)
Creatinine, Ser: 1.12 mg/dL (ref 0.76–1.27)
Globulin, Total: 2.5 g/dL (ref 1.5–4.5)
Glucose: 92 mg/dL (ref 70–99)
Potassium: 4.5 mmol/L (ref 3.5–5.2)
Sodium: 141 mmol/L (ref 134–144)
Total Protein: 6.6 g/dL (ref 6.0–8.5)
eGFR: 78 mL/min/{1.73_m2} (ref >59)

## 2023-07-10 LAB — BRAIN NATRIURETIC PEPTIDE: BNP: 6.7 pg/mL (ref 0.0–100.0)

## 2023-07-14 ENCOUNTER — Other Ambulatory Visit: Payer: Self-pay | Admitting: Family Medicine

## 2023-07-14 DIAGNOSIS — F411 Generalized anxiety disorder: Secondary | ICD-10-CM

## 2023-08-20 ENCOUNTER — Other Ambulatory Visit: Payer: Self-pay | Admitting: Family Medicine

## 2023-08-20 DIAGNOSIS — E785 Hyperlipidemia, unspecified: Secondary | ICD-10-CM

## 2023-08-20 DIAGNOSIS — E781 Pure hyperglyceridemia: Secondary | ICD-10-CM

## 2023-10-10 ENCOUNTER — Other Ambulatory Visit: Payer: Commercial Managed Care - PPO

## 2023-10-10 DIAGNOSIS — E291 Testicular hypofunction: Secondary | ICD-10-CM

## 2023-10-11 ENCOUNTER — Other Ambulatory Visit: Payer: Commercial Managed Care - PPO

## 2023-10-11 LAB — TESTOSTERONE: Testosterone: 240 ng/dL — ABNORMAL LOW (ref 264–916)

## 2023-10-11 LAB — HEMOGLOBIN AND HEMATOCRIT, BLOOD
Hematocrit: 44.9 % (ref 37.5–51.0)
Hemoglobin: 14.2 g/dL (ref 13.0–17.7)

## 2023-10-11 LAB — PSA: Prostate Specific Ag, Serum: 2 ng/mL (ref 0.0–4.0)

## 2023-10-14 ENCOUNTER — Ambulatory Visit (INDEPENDENT_AMBULATORY_CARE_PROVIDER_SITE_OTHER): Payer: Commercial Managed Care - PPO | Admitting: Urology

## 2023-10-14 ENCOUNTER — Encounter: Payer: Self-pay | Admitting: Urology

## 2023-10-14 VITALS — BP 153/91 | HR 57 | Ht 68.0 in | Wt 267.3 lb

## 2023-10-14 DIAGNOSIS — E291 Testicular hypofunction: Secondary | ICD-10-CM | POA: Diagnosis not present

## 2023-10-14 DIAGNOSIS — N401 Enlarged prostate with lower urinary tract symptoms: Secondary | ICD-10-CM

## 2023-10-14 MED ORDER — TESTOSTERONE 20.25 MG/ACT (1.62%) TD GEL: TRANSDERMAL | 1 refills | Status: DC

## 2023-10-14 NOTE — Progress Notes (Signed)
I, Garrett Holmes, acting as a scribe for Garrett Altes, MD., have documented all relevant documentation on the behalf of Garrett Altes, MD, as directed by Garrett Altes, MD while in the presence of Garrett Altes, MD.  10/14/2023 9:38 AM   [name] [birth year] [id number]  Referring provider: Melida Quitter, PA [address] [place],  [place] [address]  Chief Complaint  Patient presents with   Elevated PSA   Urologic history: 1. Lower urinary tract symptoms His symptoms were bothersome enough that he desired a trial of medical management  Tamsulosin 0.4 mg   2. Hypogonadism Significant tiredness and fatigue  HPI: Garrett Holmes is a 57 y.o. male presents for follow-up visit.  He remains on tamsulosin and has no bothersome lower urinary tract symptoms.  He elected a trial of Clomid for his hypogonadism and a repeat level while on the medication was improved at 417.  While he was taking Clomid, he did feel he had better energy and less fatigue. He did not get a refill of the medication.  Labs drawn 10/10/23 remarkable for a testosterone level of 240.   PMH: Past Medical History:  Diagnosis Date   Allergy    Anxiety    Depression    Dyslipidemia    Hyperlipidemia     Surgical History: Past Surgical History:  Procedure Laterality Date   COLONOSCOPY  2012   HUNG    Home Medications:  Allergies as of 10/14/2023       Reactions   Sudafed [pseudoephedrine Hcl]    Wires him up, agitation        Medication List        Accurate as of October 14, 2023  9:38 AM. If you have any questions, ask your nurse or doctor.          ALPRAZolam 0.25 MG tablet Commonly known as: XANAX Take 1 tablet (0.25 mg total) by mouth 2 (two) times daily as needed for anxiety.   atorvastatin 40 MG tablet Commonly known as: LIPITOR Take 1 tablet by mouth once daily   clomiPHENE 50 MG tablet Commonly known as: CLOMID Take 1/2 a tab daily.   gabapentin  100 MG capsule Commonly known as: NEURONTIN Take 1 capsule by mouth at bedtime   hydrOXYzine 10 MG tablet Commonly known as: ATARAX Take 1 tablet (10 mg total) by mouth every 8 (eight) hours as needed. for anxiety   Linzess 145 MCG Caps capsule Generic drug: linaclotide TAKE 1 CAPSULE BY MOUTH ONCE DAILY AS NEEDED   lisinopril 40 MG tablet Commonly known as: ZESTRIL Take 1 tablet (40 mg total) by mouth daily.   tamsulosin 0.4 MG Caps capsule Commonly known as: FLOMAX Take 1 capsule (0.4 mg total) by mouth daily.   Testosterone 20.25 MG/ACT (1.62%) Gel 1 pump applied to each shoulder daily Started by: Garrett Holmes   venlafaxine XR 75 MG 24 hr capsule Commonly known as: EFFEXOR-XR TAKE 1 CAPSULE BY MOUTH ONCE DAILY WITH BREAKFAST        Allergies:  Allergies  Allergen Reactions   Sudafed [Pseudoephedrine Hcl]     Wires him up, agitation    Family History: Family History  Problem Relation Age of Onset   Cancer Mother 21       Renal cancer    Social History:  reports that he quit smoking about 12 years ago. His smoking use included cigarettes. He has never been exposed  to tobacco smoke. He has never used smokeless tobacco. He reports current alcohol use of about 1.0 standard drink of alcohol per week. He reports that he does not currently use drugs after having used the following drugs: Morphine.   Physical Exam: BP (!) 153/91   Pulse (!) 57   Ht 5\' 8"  (1.727 m)   Wt 267 lb 4.8 oz (121.2 kg)   BMI 40.64 kg/m   Constitutional:  Alert, No acute distress. HEENT: [place] AT Respiratory: Normal respiratory effort, no increased work of breathing. Psychiatric: Normal mood and affect.    Assessment & Plan:    1. BPH with LUTS Doing well on tamsulosin, which he will continue.   2. Hypogonadism Presently off replacement. We discussed restarting the Clomid versus testosterone replacement.  He would like a trial of testosterone replacement with a topical gel due to  the cost of Clomid. Rx testosterone gel 1.62% was sent to pharmacy and will need a repeat testosterone level in 1 month.   I have reviewed the above documentation for accuracy and completeness, and I agree with the above.   Garrett Altes, MD  [facility] [address] [place], [place] [address] [phone]

## 2023-11-08 ENCOUNTER — Ambulatory Visit: Payer: Commercial Managed Care - PPO | Admitting: Family Medicine

## 2023-11-08 ENCOUNTER — Encounter: Payer: Self-pay | Admitting: Family Medicine

## 2023-11-08 VITALS — BP 145/83 | HR 70 | Temp 97.7°F | Ht 68.0 in | Wt 267.0 lb

## 2023-11-08 DIAGNOSIS — F411 Generalized anxiety disorder: Secondary | ICD-10-CM

## 2023-11-08 DIAGNOSIS — R351 Nocturia: Secondary | ICD-10-CM

## 2023-11-08 DIAGNOSIS — I10 Essential (primary) hypertension: Secondary | ICD-10-CM | POA: Diagnosis not present

## 2023-11-08 DIAGNOSIS — K760 Fatty (change of) liver, not elsewhere classified: Secondary | ICD-10-CM | POA: Insufficient documentation

## 2023-11-08 DIAGNOSIS — M79645 Pain in left finger(s): Secondary | ICD-10-CM | POA: Diagnosis not present

## 2023-11-08 DIAGNOSIS — G2581 Restless legs syndrome: Secondary | ICD-10-CM

## 2023-11-08 DIAGNOSIS — E782 Mixed hyperlipidemia: Secondary | ICD-10-CM

## 2023-11-08 DIAGNOSIS — Z1159 Encounter for screening for other viral diseases: Secondary | ICD-10-CM | POA: Diagnosis not present

## 2023-11-08 DIAGNOSIS — K5900 Constipation, unspecified: Secondary | ICD-10-CM

## 2023-11-08 MED ORDER — LINACLOTIDE 145 MCG PO CAPS: 145.0000 ug | ORAL_CAPSULE | Freq: Every day | ORAL | 0 refills | Status: DC | PRN

## 2023-11-08 MED ORDER — ATORVASTATIN CALCIUM 40 MG PO TABS: 40.0000 mg | ORAL_TABLET | Freq: Every day | ORAL | 0 refills | Status: DC

## 2023-11-08 MED ORDER — GABAPENTIN 100 MG PO CAPS: 100.0000 mg | ORAL_CAPSULE | Freq: Every day | ORAL | 0 refills | Status: DC

## 2023-11-08 MED ORDER — HYDROCHLOROTHIAZIDE 12.5 MG PO TABS
12.5000 mg | ORAL_TABLET | Freq: Every day | ORAL | 1 refills | Status: DC
Start: 2023-11-08 — End: 2024-04-29

## 2023-11-08 MED ORDER — VENLAFAXINE HCL ER 75 MG PO CP24: 75.0000 mg | ORAL_CAPSULE | Freq: Every day | ORAL | 1 refills | Status: DC

## 2023-11-08 MED ORDER — LISINOPRIL 40 MG PO TABS: 40.0000 mg | ORAL_TABLET | Freq: Every day | ORAL | 3 refills | Status: DC

## 2023-11-08 MED ORDER — HYDROXYZINE HCL 10 MG PO TABS: 10.0000 mg | ORAL_TABLET | Freq: Three times a day (TID) | ORAL | 1 refills | Status: AC | PRN

## 2023-11-08 NOTE — Assessment & Plan Note (Signed)
Stable.  Continue Effexor 75 mg daily and hydroxyzine 10 mg as needed for breakthrough anxiety.  Will continue to monitor.

## 2023-11-08 NOTE — Assessment & Plan Note (Signed)
Last lipid panel: LDL 75, HDL 40, triglycerides 475.  Continue atorvastatin 40 mg daily. Repeating lipid panel today, but discussed that if triglycerides remain elevated we will need to trial increase atorvastatin or adding fenofibrate to reduce risk of pancreatitis. Patient verbalized understanding and is agreeable to this plan. We will also further discuss his drinking habits and how they also affect triglyceride levels and increase risk of pancreatitis.

## 2023-11-08 NOTE — Progress Notes (Signed)
Established Patient Office Visit  Subjective   Patient ID: Garrett Holmes, male    DOB: 01-09-1966  Age: 57 y.o. MRN: [id number]  Chief Complaint  Patient presents with   Follow-up    HPI Garrett Holmes is a 57 y.o. male presenting today for follow up of hypertension, hyperlipidemia.  He also expresses concerns about occasional pain in his left knuckles.  He wonders if it may be gout.  It has not been severe enough that he has typically needed to take Tylenol or ibuprofen.  He has not noticed any particular triggers or alleviating factors.  When he is driving long periods of time, his right leg can also go numb. Hypertension: At last appointment, patient still had elevated blood pressure even after increase of lisinopril to 40 mg daily.  Discussed changing lisinopril dose from evening to morning and to continue ambulatory blood pressure monitoring.  Pt denies chest pain, SOB, dizziness, edema, syncope, fatigue or heart palpitations. Taking lisinopril 40 mg daily each morning, reports excellent compliance with treatment. Denies side effects.  He has not been checking his blood pressure on a routine basis. Hyperlipidemia: tolerating atorvastatin well with no myalgias or significant side effects.  The 10-year ASCVD risk score (Arnett DK, et al., 2019) is: 9.3%   Outpatient Medications Prior to Visit  Medication Sig   ALPRAZolam (XANAX) 0.25 MG tablet Take 1 tablet (0.25 mg total) by mouth 2 (two) times daily as needed for anxiety.   clomiPHENE (CLOMID) 50 MG tablet Take 1/2 a tab daily.   tamsulosin (FLOMAX) 0.4 MG CAPS capsule Take 1 capsule (0.4 mg total) by mouth daily.   Testosterone 20.25 MG/ACT (1.62%) GEL 1 pump applied to each shoulder daily   [DISCONTINUED] atorvastatin (LIPITOR) 40 MG tablet Take 1 tablet by mouth once daily   [DISCONTINUED] gabapentin (NEURONTIN) 100 MG capsule Take 1 capsule by mouth at bedtime   [DISCONTINUED] hydrOXYzine (ATARAX) 10 MG tablet Take 1 tablet (10 mg  total) by mouth every 8 (eight) hours as needed. for anxiety   [DISCONTINUED] LINZESS 145 MCG CAPS capsule TAKE 1 CAPSULE BY MOUTH ONCE DAILY AS NEEDED   [DISCONTINUED] lisinopril (ZESTRIL) 40 MG tablet Take 1 tablet (40 mg total) by mouth daily.   [DISCONTINUED] venlafaxine XR (EFFEXOR-XR) 75 MG 24 hr capsule TAKE 1 CAPSULE BY MOUTH ONCE DAILY WITH BREAKFAST   Facility-Administered Medications Prior to Visit  Medication Dose Route Frequency Provider   methylPREDNISolone sodium succinate (SOLU-MEDROL) 130 mg in sodium chloride 0.9 % 50 mL IVPB  130 mg Intravenous Once Abonza, Maritza, PA-C    ROS Negative unless otherwise noted in HPI   Objective:     BP (!) 145/83   Pulse 70   Temp 97.7 F (36.5 C) (Oral)   Ht 5\' 8"  (1.727 m)   Wt 267 lb (121.1 kg)   SpO2 94%   BMI 40.60 kg/m   Physical Exam Constitutional:      General: He is not in acute distress.    Appearance: Normal appearance.  HENT:     Head: Normocephalic and atraumatic.  Cardiovascular:     Rate and Rhythm: Normal rate and regular rhythm.     Heart sounds: Normal heart sounds. No murmur heard.    No friction rub. No gallop.  Pulmonary:     Effort: Pulmonary effort is normal. No respiratory distress.     Breath sounds: Normal breath sounds. No wheezing, rhonchi or rales.  Musculoskeletal:     Right hand:  No swelling, deformity, tenderness or bony tenderness. Normal range of motion.     Left hand: No swelling, deformity, tenderness or bony tenderness. Normal range of motion.  Skin:    General: Skin is warm and dry.  Neurological:     Mental Status: He is alert and oriented to person, place, and time.  Psychiatric:        Mood and Affect: Mood normal.     Assessment & Plan:  Primary hypertension Assessment & Plan: BP goal <130/80.  Blood pressure still above goal even after moving lisinopril dose to the morning.  Continue lisinopril 40 mg daily, add hydrochlorothiazide 12.5 mg daily.  Recheck blood pressure  in 1 month, until then encourage ambulatory blood pressure monitoring.  Orders: -     CBC with Differential/Platelet; Future -     Comprehensive metabolic panel; Future -     hydroCHLOROthiazide; Take 1 tablet (12.5 mg total) by mouth daily.  Dispense: 90 tablet; Refill: 1 -     Lisinopril; Take 1 tablet (40 mg total) by mouth daily.  Dispense: 90 tablet; Refill: 3  Mixed hyperlipidemia Assessment & Plan: Last lipid panel: LDL 75, HDL 40, triglycerides 475.  Continue atorvastatin 40 mg daily. Repeating lipid panel today, but discussed that if triglycerides remain elevated we will need to trial increase atorvastatin or adding fenofibrate to reduce risk of pancreatitis. Patient verbalized understanding and is agreeable to this plan. We will also further discuss his drinking habits and how they also affect triglyceride levels and increase risk of pancreatitis.  Orders: -     Comprehensive metabolic panel; Future -     Lipid panel; Future -     Hepatic function panel -     Atorvastatin Calcium; Take 1 tablet (40 mg total) by mouth daily.  Dispense: 90 tablet; Refill: 0  Screening for viral disease -     Hepatitis C antibody; Future -     HIV Antibody (routine testing w rflx); Future  Pain in left finger(s) -     Uric acid; Future  GAD (generalized anxiety disorder) Assessment & Plan: Stable.  Continue Effexor 75 mg daily and hydroxyzine 10 mg as needed for breakthrough anxiety.  Will continue to monitor.  Orders: -     Venlafaxine HCl ER; Take 1 capsule (75 mg total) by mouth daily with breakfast.  Dispense: 60 capsule; Refill: 1 -     hydrOXYzine HCl; Take 1 tablet (10 mg total) by mouth every 8 (eight) hours as needed. for anxiety  Dispense: 30 tablet; Refill: 1  Constipation, unspecified constipation type -     linaCLOtide; Take 1 capsule (145 mcg total) by mouth daily as needed.  Dispense: 90 capsule; Refill: 0  Restless leg syndrome -     Gabapentin; Take 1 capsule (100 mg  total) by mouth at bedtime.  Dispense: 90 capsule; Refill: 0  Nocturia -     PSA  Checking uric acid given patient's concern for gout.  Otherwise, recommend conservative measures including Tylenol/ibuprofen, ice or heat, and gentle stretches for the next 2-4 weeks.  After that time, patient will send me a MyChart message if pain has not improved to decide if imaging is necessary.  Return in about 4 months (around 03/07/2024) for annual physical, fasting blood work 1 week before.    Melida Quitter, PA

## 2023-11-08 NOTE — Assessment & Plan Note (Addendum)
BP goal <130/80.  Blood pressure still above goal even after moving lisinopril dose to the morning.  Continue lisinopril 40 mg daily, add hydrochlorothiazide 12.5 mg daily.  Recheck blood pressure in 1 month, until then encourage ambulatory blood pressure monitoring.

## 2023-11-08 NOTE — Patient Instructions (Signed)
Until the results of your lab work are back, try taking ibuprofen 400 mg every 6 hours, using ice or heat, and doing the exercises that I provided at least once a day.  If this is not offering any improvement, please send me a MyChart message in about 3 weeks to let me know so that we can proceed with next steps.

## 2023-11-09 LAB — CBC WITH DIFFERENTIAL/PLATELET
Basophils Absolute: 0.1 10*3/uL (ref 0.0–0.2)
Basos: 1 %
EOS (ABSOLUTE): 0.2 10*3/uL (ref 0.0–0.4)
Eos: 2 %
Hematocrit: 45.5 % (ref 37.5–51.0)
Hemoglobin: 15.2 g/dL (ref 13.0–17.7)
Immature Grans (Abs): 0.1 10*3/uL (ref 0.0–0.1)
Immature Granulocytes: 1 %
Lymphocytes Absolute: 2.3 10*3/uL (ref 0.7–3.1)
Lymphs: 23 %
MCH: 30.2 pg (ref 26.6–33.0)
MCHC: 33.4 g/dL (ref 31.5–35.7)
MCV: 91 fL (ref 79–97)
Monocytes Absolute: 1 10*3/uL — ABNORMAL HIGH (ref 0.1–0.9)
Monocytes: 10 %
Neutrophils Absolute: 6.4 10*3/uL (ref 1.4–7.0)
Neutrophils: 63 %
Platelets: 228 10*3/uL (ref 150–450)
RBC: 5.03 x10E6/uL (ref 4.14–5.80)
RDW: 13.3 % (ref 11.6–15.4)
WBC: 10 10*3/uL (ref 3.4–10.8)

## 2023-11-09 LAB — LIPID PANEL
Chol/HDL Ratio: 3.8 ratio (ref 0.0–5.0)
Cholesterol, Total: 168 mg/dL (ref 100–199)
HDL: 44 mg/dL
LDL Chol Calc (NIH): 95 mg/dL (ref 0–99)
Triglycerides: 166 mg/dL — ABNORMAL HIGH (ref 0–149)
VLDL Cholesterol Cal: 29 mg/dL (ref 5–40)

## 2023-11-09 LAB — COMPREHENSIVE METABOLIC PANEL WITH GFR
ALT: 41 IU/L (ref 0–44)
AST: 26 IU/L (ref 0–40)
Albumin: 4.4 g/dL (ref 3.8–4.9)
Alkaline Phosphatase: 79 IU/L (ref 44–121)
BUN/Creatinine Ratio: 13 (ref 9–20)
BUN: 16 mg/dL (ref 6–24)
Bilirubin Total: 0.3 mg/dL (ref 0.0–1.2)
CO2: 21 mmol/L (ref 20–29)
Calcium: 9.5 mg/dL (ref 8.7–10.2)
Chloride: 106 mmol/L (ref 96–106)
Creatinine, Ser: 1.21 mg/dL (ref 0.76–1.27)
Globulin, Total: 2.7 g/dL (ref 1.5–4.5)
Glucose: 85 mg/dL (ref 70–99)
Potassium: 5 mmol/L (ref 3.5–5.2)
Sodium: 141 mmol/L (ref 134–144)
Total Protein: 7.1 g/dL (ref 6.0–8.5)
eGFR: 71 mL/min/1.73

## 2023-11-09 LAB — PSA: Prostate Specific Ag, Serum: 2.4 ng/mL (ref 0.0–4.0)

## 2023-11-09 LAB — HIV ANTIBODY (ROUTINE TESTING W REFLEX): HIV Screen 4th Generation wRfx: NONREACTIVE

## 2023-11-09 LAB — HEPATIC FUNCTION PANEL: Bilirubin, Direct: 0.09 mg/dL (ref 0.00–0.40)

## 2023-11-09 LAB — HEPATITIS C ANTIBODY: Hep C Virus Ab: NONREACTIVE

## 2023-11-09 LAB — URIC ACID: Uric Acid: 7.1 mg/dL (ref 3.8–8.4)

## 2023-12-09 ENCOUNTER — Ambulatory Visit (INDEPENDENT_AMBULATORY_CARE_PROVIDER_SITE_OTHER): Payer: Commercial Managed Care - PPO | Admitting: Family Medicine

## 2023-12-09 VITALS — BP 160/87 | HR 78

## 2023-12-09 DIAGNOSIS — I10 Essential (primary) hypertension: Secondary | ICD-10-CM

## 2023-12-09 NOTE — Progress Notes (Signed)
Pt denies CP, SOB, dizziness, or heart palpitations. taking meds as directed without problems. Denies med side effects. 5 min spent with pt.    Blood pressure log has been provided. Patient instructed to monitor BP for 2 weeks and submit to provider via Mychart.

## 2024-01-17 ENCOUNTER — Other Ambulatory Visit: Payer: Self-pay | Admitting: Urology

## 2024-01-17 ENCOUNTER — Encounter: Payer: Self-pay | Admitting: *Deleted

## 2024-01-23 ENCOUNTER — Encounter: Payer: Self-pay | Admitting: Family Medicine

## 2024-02-18 ENCOUNTER — Other Ambulatory Visit: Payer: Self-pay | Admitting: Family Medicine

## 2024-02-18 DIAGNOSIS — E782 Mixed hyperlipidemia: Secondary | ICD-10-CM

## 2024-02-20 ENCOUNTER — Other Ambulatory Visit: Payer: Self-pay

## 2024-02-20 ENCOUNTER — Other Ambulatory Visit: Payer: Commercial Managed Care - PPO

## 2024-02-20 DIAGNOSIS — E291 Testicular hypofunction: Secondary | ICD-10-CM

## 2024-02-20 DIAGNOSIS — E782 Mixed hyperlipidemia: Secondary | ICD-10-CM

## 2024-02-20 DIAGNOSIS — I10 Essential (primary) hypertension: Secondary | ICD-10-CM

## 2024-02-20 DIAGNOSIS — Z0000 Encounter for general adult medical examination without abnormal findings: Secondary | ICD-10-CM

## 2024-02-21 LAB — TESTOSTERONE: Testosterone: 210 ng/dL — ABNORMAL LOW (ref 264–916)

## 2024-02-26 ENCOUNTER — Other Ambulatory Visit: Payer: Self-pay | Admitting: Urology

## 2024-02-27 ENCOUNTER — Other Ambulatory Visit: Payer: Self-pay | Admitting: Urology

## 2024-02-27 MED ORDER — TESTOSTERONE CYPIONATE 200 MG/ML IM SOLN: 200.0000 mg | INTRAMUSCULAR | 0 refills | Status: DC

## 2024-03-02 ENCOUNTER — Other Ambulatory Visit: Payer: Commercial Managed Care - PPO

## 2024-03-02 DIAGNOSIS — I10 Essential (primary) hypertension: Secondary | ICD-10-CM

## 2024-03-02 DIAGNOSIS — E782 Mixed hyperlipidemia: Secondary | ICD-10-CM

## 2024-03-02 DIAGNOSIS — Z0000 Encounter for general adult medical examination without abnormal findings: Secondary | ICD-10-CM

## 2024-03-02 DIAGNOSIS — R0609 Other forms of dyspnea: Secondary | ICD-10-CM

## 2024-03-03 LAB — CBC WITH DIFFERENTIAL/PLATELET
Basophils Absolute: 0.1 10*3/uL (ref 0.0–0.2)
Basos: 1 %
EOS (ABSOLUTE): 0.2 10*3/uL (ref 0.0–0.4)
Eos: 2 %
Hematocrit: 42.8 % (ref 37.5–51.0)
Hemoglobin: 14.3 g/dL (ref 13.0–17.7)
Immature Grans (Abs): 0.1 10*3/uL (ref 0.0–0.1)
Immature Granulocytes: 1 %
Lymphocytes Absolute: 2.4 10*3/uL (ref 0.7–3.1)
Lymphs: 24 %
MCH: 30.2 pg (ref 26.6–33.0)
MCHC: 33.4 g/dL (ref 31.5–35.7)
MCV: 90 fL (ref 79–97)
Monocytes Absolute: 0.9 10*3/uL (ref 0.1–0.9)
Monocytes: 9 %
Neutrophils Absolute: 6.6 10*3/uL (ref 1.4–7.0)
Neutrophils: 63 %
Platelets: 255 10*3/uL (ref 150–450)
RBC: 4.74 x10E6/uL (ref 4.14–5.80)
RDW: 12.8 % (ref 11.6–15.4)
WBC: 10.2 10*3/uL (ref 3.4–10.8)

## 2024-03-03 LAB — LIPID PANEL
Chol/HDL Ratio: 3.9 ratio (ref 0.0–5.0)
Cholesterol, Total: 163 mg/dL (ref 100–199)
HDL: 42 mg/dL (ref >39)
LDL Chol Calc (NIH): 84 mg/dL (ref 0–99)
Triglycerides: 223 mg/dL — ABNORMAL HIGH (ref 0–149)
VLDL Cholesterol Cal: 37 mg/dL (ref 5–40)

## 2024-03-03 LAB — HEMOGLOBIN A1C
Est. average glucose Bld gHb Est-mCnc: 126 mg/dL
Hgb A1c MFr Bld: 6 % — ABNORMAL HIGH (ref 4.8–5.6)

## 2024-03-03 LAB — TSH: TSH: 1.91 u[IU]/mL (ref 0.450–4.500)

## 2024-03-03 NOTE — Progress Notes (Unsigned)
 Garrett Holmes is a 58 y.o.  male with hypogonadism who presents today for instruction on delivering an IM injection of testosterone cypionate.    I instructed the patient to identify the concentration of his testosterone. Testosterone for injection is usually in the form of testosterone cypionate. These liquids come in multiple concentrations, so before giving an injection, it's very important to make sure that his intended dosage takes into account the concentration of the testosterone serum. Usually, testosterone comes in a concentration of either 100 mg/ml or 200 mg/ml.  We typically use the 200 mg/mL in this office.    Using a sterile, 18 G needle and 3 cc syringe, the testosterone cypionate was drawn up for a 1  cc injection.  To draw up the dose, I demonstrated how to first draw air into the syringe equal to the volume of the dosage. Then, wipe the top of the medication bottle with an alcohol wipe, insert the needle through the lid and into the medication, and push the air from your syringe into the bottle. Turn the bottle upside down and draw out the exact dosage of testosterone.  I demonstrated how to aspirate the syringe by hinge the syringe with its needle uncapped and pointing up in front of him.  Looking for air bubbles in the syringe. Flick the side of the syringe to get these bubbles to rise to the top.    When the dosage is bubble-free, I slowly depressed the plunger to force the air at the top of the syringe out stopping when a tiny drop of medication comes out of the tip of the syringe.  I advised him to be certain no air remained in the syringe as injecting air is very dangerous.  Being careful not to squirt or spray a significant portion of the dosage onto the floor.  Preparing the injection site, outer middle third of the vastus lateralis muscle of the thigh, I took a sterile alcohol pad and wipe the immediate area around where I intended him to inject.   I then demonstrated how  to change the needle from the 18 G to the 21 G needle.  I then gave him the syringe for injecting.  He correctly identified the injection location.  He held the syringe like a dart at a 90-degree angle above the sterile injection site. Quickly plunged it into the flesh. Before depressing the plunger, he drew back on it slightly and no blood was seen.  I advised him that if blood flashed in the syringe, he would need to remove the needle and then select a different location as he was in the vein.  Inject the medication at a steady, controlled pace.  He fully depressed the plunger, slowly pull the needle out. Pressing around the injection site with a sterile cotton swab as he did so - this preventing the emerging needle from pulling on the skin and causing extra pain.  We assessed the needle entry point for bleeding, and applied a sterile Band-Aid and/or cotton swab if needed. Disposed of the used needle and syringe in a proper sharps container.  I advised him to acquire a sharps container for his personal use at home.  I advised him that If, after injection, he experienced redness, swelling, or discomfort beyond that of normal soreness at the site of injection, call our office for an appointment and instructions.  He is to always store his medication at the recommended temperature, and always check the expiration  date on the bottle. If it's expired, don't use it.  Of course, keep all of med's out of reach of children.  Do not change his dose without consulting your provider.  His starting dose will be 1 cc every 2 weeks.  He will return 1 week after his fourth injection for a testosterone level, hemoglobin and hematocrit  Demauri Advincula, PA-C  I spent 15 minutes on the day of the encounter to include pre-visit record review, face-to-face time with the patient, and post-visit ordering of tests.

## 2024-03-05 ENCOUNTER — Ambulatory Visit (INDEPENDENT_AMBULATORY_CARE_PROVIDER_SITE_OTHER): Admitting: Urology

## 2024-03-05 ENCOUNTER — Encounter: Payer: Self-pay | Admitting: Urology

## 2024-03-05 VITALS — BP 130/83 | HR 87 | Ht 68.0 in | Wt 260.0 lb

## 2024-03-05 DIAGNOSIS — E291 Testicular hypofunction: Secondary | ICD-10-CM | POA: Diagnosis not present

## 2024-03-05 MED ORDER — BD DISP NEEDLES 21G X 1-1/2" MISC: 1.0000 mg | 0 refills | Status: AC

## 2024-03-05 MED ORDER — BD DISP NEEDLES 18G X 1-1/2" MISC: 1.0000 mg | 0 refills | Status: AC

## 2024-03-05 MED ORDER — SYRINGE 2-3 ML 3 ML MISC: 1.0000 mg | 3 refills | Status: AC

## 2024-03-07 ENCOUNTER — Ambulatory Visit
Admission: EM | Admit: 2024-03-07 | Discharge: 2024-03-07 | Disposition: A | Discharge status: Home / Self Care | Attending: Internal Medicine | Admitting: Internal Medicine

## 2024-03-07 ENCOUNTER — Other Ambulatory Visit: Payer: Self-pay

## 2024-03-07 ENCOUNTER — Encounter: Payer: Self-pay | Admitting: *Deleted

## 2024-03-07 DIAGNOSIS — R051 Acute cough: Secondary | ICD-10-CM

## 2024-03-07 DIAGNOSIS — R062 Wheezing: Secondary | ICD-10-CM | POA: Diagnosis not present

## 2024-03-07 MED ORDER — ALBUTEROL SULFATE HFA 108 (90 BASE) MCG/ACT IN AERS: 1.0000 | INHALATION_SPRAY | Freq: Four times a day (QID) | RESPIRATORY_TRACT | 0 refills | Status: AC | PRN

## 2024-03-07 MED ORDER — BENZONATATE 100 MG PO CAPS: 100.0000 mg | ORAL_CAPSULE | Freq: Three times a day (TID) | ORAL | 0 refills | Status: DC | PRN

## 2024-03-07 NOTE — ED Triage Notes (Signed)
 Pt reports cough since Wednesday. Chest feels tight and hurts from coughing. Denies known fever. Tried dayquil, delsym, and OTC allergy meds without relief.

## 2024-03-07 NOTE — Discharge Instructions (Signed)
 I have sent you an inhaler and a cough medication for your current symptoms.  Please follow-up if any symptoms persist or worsen.

## 2024-03-07 NOTE — ED Provider Notes (Signed)
 [facility]    CSN: [id number] Arrival date & time: 03/07/24  1049      History   Chief Complaint Chief Complaint  Patient presents with   Cough    HPI Garrett Holmes is a 58 y.o. male.   Patient presents with cough that has been present for about 4 days.  Reports that symptoms started with a runny nose which is now resolved.  Denies fever, body aches, chills.  Denies any known sick contacts.  Reports that he has been told previously that he has asthma and previously used albuterol inhaler.  Reports that he does have feelings of chest tightness when he coughs but otherwise denies shortness of breath.  He has used over-the-counter cough medication with minimal improvement.   Cough   Past Medical History:  Diagnosis Date   Allergy    Anxiety    Depression    Dyslipidemia    Hyperlipidemia     Patient Active Problem List   Diagnosis Date Noted   Fatty liver 11/08/2023   Chronic swimmer's ear of both sides 08/23/2021   Thrombosed hemorrhoids 04/05/2021   Excessive drinking alcohol on Fri & Sat 02/23/2020   Hypertension 10/12/2019   GAD (generalized anxiety disorder) 09/14/2019   OSA (obstructive sleep apnea) 05/04/2018   Snoring 05/04/2018   Former smoker 03/13/2016   Non-seasonal allergic rhinitis 03/13/2016   Hyperlipidemia 05/22/2011   Obesity (BMI 30-39.9) 05/22/2011    Past Surgical History:  Procedure Laterality Date   COLONOSCOPY  2012   HUNG       Home Medications    Prior to Admission medications   Medication Sig Start Date End Date Taking? Authorizing Provider  albuterol (VENTOLIN HFA) 108 (90 Base) MCG/ACT inhaler Inhale 1-2 puffs into the lungs every 6 (six) hours as needed for wheezing or shortness of breath. 03/07/24  Yes Gustavus Bryant, FNP  atorvastatin (LIPITOR) 40 MG tablet Take 1 tablet by mouth once daily 02/18/24  Yes Edstrom, Morgan A, PA  benzonatate (TESSALON) 100 MG capsule Take 1 capsule (100 mg total) by mouth every 8  (eight) hours as needed for cough. 03/07/24  Yes Greyden Besecker, Rolly Salter E, FNP  hydrochlorothiazide (HYDRODIURIL) 12.5 MG tablet Take 1 tablet (12.5 mg total) by mouth daily. 11/08/23  Yes Saralyn Pilar A, PA  hydrOXYzine (ATARAX) 10 MG tablet Take 1 tablet (10 mg total) by mouth every 8 (eight) hours as needed. for anxiety 11/08/23  Yes Saralyn Pilar A, PA  linaclotide (LINZESS) 145 MCG CAPS capsule Take 1 capsule (145 mcg total) by mouth daily as needed. 11/08/23  Yes Saralyn Pilar A, PA  lisinopril (ZESTRIL) 40 MG tablet Take 1 tablet (40 mg total) by mouth daily. 11/08/23  Yes Saralyn Pilar A, PA  NEEDLE, DISP, 18 G (BD DISP NEEDLES) 18G X 1-1/2" MISC 1 mg by Does not apply route every 14 (fourteen) days. 03/05/24  Yes McGowan, Carollee Herter A, PA-C  NEEDLE, DISP, 21 G (BD DISP NEEDLES) 21G X 1-1/2" MISC 1 mg by Does not apply route every 14 (fourteen) days. 03/05/24  Yes McGowan, Carollee Herter A, PA-C  Syringe, Disposable, (2-3CC SYRINGE) 3 ML MISC 1 mg by Does not apply route every 14 (fourteen) days. 03/05/24  Yes McGowan, Carollee Herter A, PA-C  tamsulosin (FLOMAX) 0.4 MG CAPS capsule Take 1 capsule (0.4 mg total) by mouth daily. 04/29/23  Yes Stoioff, Verna Czech, MD  testosterone cypionate (DEPOTESTOSTERONE CYPIONATE) 200 MG/ML injection Inject 1 mL (200 mg total) into the muscle every 14 (  fourteen) days. 02/27/24  Yes Stoioff, Verna Czech, MD  venlafaxine XR (EFFEXOR-XR) 75 MG 24 hr capsule Take 1 capsule (75 mg total) by mouth daily with breakfast. 11/08/23  Yes Saralyn Pilar A, PA  ALPRAZolam (XANAX) 0.25 MG tablet Take 1 tablet (0.25 mg total) by mouth 2 (two) times daily as needed for anxiety. Patient not taking: Reported on 03/07/2024 12/15/13   Ronnald Nian, MD  gabapentin (NEURONTIN) 100 MG capsule Take 1 capsule (100 mg total) by mouth at bedtime. Patient not taking: Reported on 03/07/2024 11/08/23   Saralyn Pilar A, PA  metoprolol succinate (TOPROL-XL) 25 MG 24 hr tablet Take 1 tablet (25 mg total) by mouth  daily. 01/31/12 02/21/12  Ronnald Nian, MD    Family History Family History  Problem Relation Age of Onset   Cancer Mother 53       Renal cancer    Social History Social History   Tobacco Use   Smoking status: Former    Current packs/day: 0.00    Types: Cigarettes    Quit date: 12/17/2010    Years since quitting: 13.2    Passive exposure: Never   Smokeless tobacco: Never  Vaping Use   Vaping status: Never Used  Substance Use Topics   Alcohol use: Yes    Alcohol/week: 1.0 standard drink of alcohol    Types: 1 Cans of beer per week    Comment: weekends   Drug use: Not Currently    Types: Morphine     Allergies   Sudafed [pseudoephedrine hcl]   Review of Systems Review of Systems Per HPI  Physical Exam Triage Vital Signs ED Triage Vitals  Encounter Vitals Group     BP 03/07/24 1105 130/82     Systolic BP Percentile --      Diastolic BP Percentile --      Pulse Rate 03/07/24 1105 91     Resp 03/07/24 1105 18     Temp 03/07/24 1105 98.5 F (36.9 C)     Temp Source 03/07/24 1105 Oral     SpO2 03/07/24 1105 95 %     Weight --      Height --      Head Circumference --      Peak Flow --      Pain Score 03/07/24 1059 4     Pain Loc --      Pain Education --      Exclude from Growth Chart --    No data found.  Updated Vital Signs BP 130/82 (BP Location: Left Arm)   Pulse 91   Temp 98.5 F (36.9 C) (Oral)   Resp 18   SpO2 95%   Visual Acuity Right Eye Distance:   Left Eye Distance:   Bilateral Distance:    Right Eye Near:   Left Eye Near:    Bilateral Near:     Physical Exam Constitutional:      General: He is not in acute distress.    Appearance: Normal appearance. He is not toxic-appearing or diaphoretic.  HENT:     Head: Normocephalic and atraumatic.     Right Ear: Tympanic membrane and ear canal normal.     Left Ear: Tympanic membrane and ear canal normal.     Nose: Nose normal.     Mouth/Throat:     Mouth: Mucous membranes are moist.      Pharynx: No posterior oropharyngeal erythema.  Eyes:     Extraocular Movements: Extraocular movements intact.  Conjunctiva/sclera: Conjunctivae normal.  Cardiovascular:     Rate and Rhythm: Normal rate and regular rhythm.     Pulses: Normal pulses.     Heart sounds: Normal heart sounds.  Pulmonary:     Effort: Pulmonary effort is normal.     Breath sounds: Wheezing present.     Comments: Minimal wheezing noted to auscultation bilaterally.  Neurological:     General: No focal deficit present.     Mental Status: He is alert and oriented to person, place, and time. Mental status is at baseline.  Psychiatric:        Mood and Affect: Mood normal.        Behavior: Behavior normal.        Thought Content: Thought content normal.        Judgment: Judgment normal.      UC Treatments / Results  Labs (all labs ordered are listed, but only abnormal results are displayed) Labs Reviewed - No data to display  EKG   Radiology No results found.  Procedures Procedures (including critical care time)  Medications Ordered in UC Medications - No data to display  Initial Impression / Assessment and Plan / UC Course  I have reviewed the triage vital signs and the nursing notes.  Pertinent labs & imaging results that were available during my care of the patient were reviewed by me and considered in my medical decision making (see chart for details).     Differential diagnoses include mild asthma exacerbation versus acute bronchitis.  Most likely due to season change but could be viral in nature.  There are no signs of secondary bacterial infection on exam and oxygen is normal so do not think that chest imaging is necessary.  Will treat with albuterol inhaler and benzonatate for cough.  Patient declined viral testing.  Advised strict return and ER precautions.  Patient verbalized understanding and was agreeable with plan. Final Clinical Impressions(s) / UC Diagnoses   Final  diagnoses:  Acute cough  Wheezing     Discharge Instructions      I have sent you an inhaler and a cough medication for your current symptoms.  Please follow-up if any symptoms persist or worsen.    ED Prescriptions     Medication Sig Dispense Auth. Provider   albuterol (VENTOLIN HFA) 108 (90 Base) MCG/ACT inhaler Inhale 1-2 puffs into the lungs every 6 (six) hours as needed for wheezing or shortness of breath. 1 each [place], [place] E, [place]   benzonatate (TESSALON) 100 MG capsule Take 1 capsule (100 mg total) by mouth every 8 (eight) hours as needed for cough. 21 capsule [place], Acie Fredrickson, [place]      PDMP not reviewed this encounter.   Gustavus Bryant, [place] 03/07/24 1125

## 2024-03-09 ENCOUNTER — Encounter: Payer: Commercial Managed Care - PPO | Admitting: Family Medicine

## 2024-03-11 ENCOUNTER — Ambulatory Visit (INDEPENDENT_AMBULATORY_CARE_PROVIDER_SITE_OTHER): Admitting: Family Medicine

## 2024-03-11 ENCOUNTER — Encounter: Payer: Self-pay | Admitting: Family Medicine

## 2024-03-11 VITALS — BP 137/77 | HR 76 | Ht 68.0 in | Wt 271.4 lb

## 2024-03-11 DIAGNOSIS — Z0000 Encounter for general adult medical examination without abnormal findings: Secondary | ICD-10-CM | POA: Diagnosis not present

## 2024-03-11 DIAGNOSIS — F411 Generalized anxiety disorder: Secondary | ICD-10-CM

## 2024-03-11 DIAGNOSIS — I10 Essential (primary) hypertension: Secondary | ICD-10-CM

## 2024-03-11 DIAGNOSIS — E782 Mixed hyperlipidemia: Secondary | ICD-10-CM | POA: Diagnosis not present

## 2024-03-11 DIAGNOSIS — K5900 Constipation, unspecified: Secondary | ICD-10-CM

## 2024-03-11 NOTE — Assessment & Plan Note (Signed)
 Hydrochlorothiazide and lisinopril.  Blood pressure at goal.  Normal kidney function.

## 2024-03-11 NOTE — Assessment & Plan Note (Signed)
Continue Effexor daily

## 2024-03-11 NOTE — Assessment & Plan Note (Signed)
 Continue atorvastatin.  Cholesterol level at goal.

## 2024-03-11 NOTE — Patient Instructions (Signed)
 It was nice to see you today,  We addressed the following topics today: -Continue taking your medication. - You can call your insurance company and asked them if there are any circumstances in which they would cover Zepbound or Wegovy.  If they do let us know and we can send it in and then tried to get it approved. - Otherwise I will see you back in 1 year or sooner if needed.  Have a great day,  Frederic Jericho, MD

## 2024-03-11 NOTE — Assessment & Plan Note (Signed)
 Continue Linzess.

## 2024-03-11 NOTE — Progress Notes (Signed)
   Annual physical  Subjective   Patient ID: Garrett Holmes, male    DOB: [birth year]  Age: 58 y.o. MRN: [id number]  Chief Complaint  Patient presents with   Annual Exam   HPI Garrett Holmes is a 58 y.o. old male here  for annual exam.   We discussed the patient's lab work including the elevated A1c, elevated triglycerides, low testosterone level.  He recently switched from testosterone cream to injections last Thursday.  This is prescribed by his urologist.  Patient has sleep apnea and wears a CPAP.  Wants to know if he needs to have a repeat sleep study.  Has been several years since his last 1.  Still feels like the current CPAP works well.  Patient takes Linzess for constipation.    Work:equipment Production designer, theatre/television/film for [employer].   Relationship:married Children:2 children.   Tobacco:quit 15 years ago.  Smoked for 20 years, ppd.   Alcohol:beer on the weekends.   Recreational drugs:no  Diet:regular Exercise:no  Family history of prostate or colorectal cancer:no   The 10-year ASCVD risk score (Arnett DK, et al., 2019) is: 6.8%  Health Maintenance Due  Topic Date Due   COVID-19 Vaccine (3 - 2024-25 season) 08/18/2023      Objective:     BP 137/77   Pulse 76   Ht 5\' 8"  (1.727 m)   Wt 271 lb 6.4 oz (123.1 kg)   SpO2 95%   BMI 41.27 kg/m    Physical Exam General: Alert, oriented HEENT: PERRLA, EOMI, moist mucosa CV: Regular rate and rhythm Pulmonary: Clear bilaterally GI: Soft, normal bowel sounds MSK: Strength equal bilaterally   No results found for any visits on 03/11/24.      Assessment & Plan:   Physical exam, annual  Primary hypertension Assessment & Plan: Hydrochlorothiazide and lisinopril.  Blood pressure at goal.  Normal kidney function.   Mixed hyperlipidemia Assessment & Plan: Continue atorvastatin.  Cholesterol level at goal.   GAD (generalized anxiety disorder) Assessment & Plan: Continue Effexor daily   Constipation, unspecified  constipation type Assessment & Plan: Continue Linzess      Return in about 1 year (around 03/11/2025) for physical.    Sandre Kitty, MD

## 2024-03-26 ENCOUNTER — Other Ambulatory Visit: Payer: Self-pay | Admitting: Family Medicine

## 2024-03-26 DIAGNOSIS — K5900 Constipation, unspecified: Secondary | ICD-10-CM

## 2024-04-16 ENCOUNTER — Other Ambulatory Visit

## 2024-04-21 ENCOUNTER — Other Ambulatory Visit

## 2024-04-21 ENCOUNTER — Other Ambulatory Visit: Payer: Self-pay

## 2024-04-21 DIAGNOSIS — N401 Enlarged prostate with lower urinary tract symptoms: Secondary | ICD-10-CM

## 2024-04-21 DIAGNOSIS — E291 Testicular hypofunction: Secondary | ICD-10-CM

## 2024-04-22 LAB — TESTOSTERONE: Testosterone: 1130 ng/dL — ABNORMAL HIGH (ref 264–916)

## 2024-04-22 LAB — HEMOGLOBIN AND HEMATOCRIT, BLOOD
Hematocrit: 46.3 % (ref 37.5–51.0)
Hemoglobin: 15.2 g/dL (ref 13.0–17.7)

## 2024-04-25 ENCOUNTER — Other Ambulatory Visit: Payer: Self-pay | Admitting: Family Medicine

## 2024-04-25 DIAGNOSIS — F411 Generalized anxiety disorder: Secondary | ICD-10-CM

## 2024-04-29 ENCOUNTER — Other Ambulatory Visit: Payer: Self-pay | Admitting: Family Medicine

## 2024-04-29 DIAGNOSIS — I10 Essential (primary) hypertension: Secondary | ICD-10-CM

## 2024-05-01 ENCOUNTER — Ambulatory Visit (INDEPENDENT_AMBULATORY_CARE_PROVIDER_SITE_OTHER): Payer: Self-pay | Admitting: Urology

## 2024-05-01 ENCOUNTER — Encounter: Payer: Self-pay | Admitting: Urology

## 2024-05-01 VITALS — BP 122/79 | HR 65 | Ht 68.0 in | Wt 260.0 lb

## 2024-05-01 DIAGNOSIS — E291 Testicular hypofunction: Secondary | ICD-10-CM | POA: Diagnosis not present

## 2024-05-01 NOTE — Progress Notes (Signed)
 I, Maysun Jamey Mccallum, acting as a Neurosurgeon for Geraline Knapp, MD., have documented all relevant documentation on the behalf of Geraline Knapp, MD, as directed by Geraline Knapp, MD while in the presence of Geraline Knapp, MD.  Discussed the use of AI scribe software for clinical note transcription with the patient, who gave verbal consent to proceed.   05/01/2024 3:57 PM   Irvin Mantel [birth year] [id number]  Referring provider: Noreene Bearded, PA [address] [place] [place],  [place] [address]  Chief Complaint  Patient presents with   Benign Prostatic Hypertrophy   Hypogonadism   Urologic history: 1. Lower urinary tract symptoms His symptoms were bothersome enough that he desired a trial of medical management  Tamsulosin  0.4 mg   2. Hypogonadism Significant tiredness and fatigue  HPI: Garrett Holmes is a 58 y.o. male presents for follow-up.  At his visit October 2024, he desired changing from Clomid  to TRT. He was initially started on a topical gel though was not achieving adequate levels. He was started on intramuscular injections March 2025, currently injecting 200 mg every 2 weeks.  He has not noted significant improvement in his tiredness and fatigue, and feels it is as effective as the Clomid , though he was still symptomatic at that time.  Labs drawn 04/21/2024 remarkable for testosterone  level 1,130, and a normal H/H.  His injection was the evening prior to his blood draw.   PMH: Past Medical History:  Diagnosis Date   Allergy    Anxiety    Depression    Dyslipidemia    Hyperlipidemia     Surgical History: Past Surgical History:  Procedure Laterality Date   COLONOSCOPY  2012   HUNG    Home Medications:  Allergies as of 05/01/2024       Reactions   Sudafed [pseudoephedrine Hcl]    Wires him up, agitation        Medication List        Accurate as of May 01, 2024  3:57 PM. If you have any questions, ask your nurse or doctor.           STOP taking these medications    benzonatate  100 MG capsule Commonly known as: TESSALON  Stopped by: Jaysen Wey C Tharon Bomar   metoprolol  succinate 25 MG 24 hr tablet Commonly known as: TOPROL -XL Stopped by: Geraline Knapp       TAKE these medications    2-3CC SYRINGE 3 ML Misc 1 mg by Does not apply route every 14 (fourteen) days.   albuterol  108 (90 Base) MCG/ACT inhaler Commonly known as: VENTOLIN  HFA Inhale 1-2 puffs into the lungs every 6 (six) hours as needed for wheezing or shortness of breath.   atorvastatin  40 MG tablet Commonly known as: LIPITOR  Take 1 tablet by mouth once daily   BD Disp Needles 18G X 1-1/2" Misc Generic drug: NEEDLE (DISP) 18 G 1 mg by Does not apply route every 14 (fourteen) days.   BD Disp Needles 21G X 1-1/2" Misc Generic drug: NEEDLE (DISP) 21 G 1 mg by Does not apply route every 14 (fourteen) days.   hydrochlorothiazide  12.5 MG tablet Commonly known as: HYDRODIURIL  Take 1 tablet by mouth once daily   hydrOXYzine  10 MG tablet Commonly known as: ATARAX  Take 1 tablet (10 mg total) by mouth every 8 (eight) hours as needed. for anxiety   Linzess  145 MCG Caps capsule Generic drug: linaclotide  TAKE 1 CAPSULE BY MOUTH ONCE DAILY  AS  NEEDED   lisinopril  40 MG tablet Commonly known as: ZESTRIL  Take 1 tablet (40 mg total) by mouth daily.   tamsulosin  0.4 MG Caps capsule Commonly known as: FLOMAX  Take 1 capsule (0.4 mg total) by mouth daily.   testosterone  cypionate 200 MG/ML injection Commonly known as: DEPOTESTOSTERONE CYPIONATE Inject 1 mL (200 mg total) into the muscle every 14 (fourteen) days.   venlafaxine  XR 75 MG 24 hr capsule Commonly known as: EFFEXOR -XR TAKE 1 CAPSULE BY MOUTH ONCE DAILY WITH BREAKFAST        Allergies:  Allergies  Allergen Reactions   Sudafed [Pseudoephedrine Hcl]     Wires him up, agitation    Family History: Family History  Problem Relation Age of Onset   Cancer Mother 86       Renal cancer     Social History:  reports that he quit smoking about 13 years ago. His smoking use included cigarettes. He has never been exposed to tobacco smoke. He has never used smokeless tobacco. He reports current alcohol use of about 1.0 standard drink of alcohol per week. He reports that he does not use drugs.   Physical Exam: BP 122/79   Pulse 65   Ht 5\' 8"  (1.727 m)   Wt 260 lb (117.9 kg)   BMI 39.53 kg/m   Constitutional:  Alert and oriented, No acute distress. HEENT: [place] AT Respiratory: Normal respiratory effort, no increased work of breathing. Psychiatric: Normal mood and affect.   Assessment & Plan:    1. Hypogonadism Persistent tiredness and fatigue despite TRT. Plan to recheck trough testosterone  level today to assess current levels and adjust treatment if necessary. Patient will be notified of lab results and further recommendations will be provided based on those results.  I have reviewed the above documentation for accuracy and completeness, and I agree with the above.   Geraline Knapp, MD  [facility] [address], [address] [place], [place] [address] [phone]

## 2024-05-02 LAB — TESTOSTERONE: Testosterone: 522 ng/dL (ref 264–916)

## 2024-05-04 ENCOUNTER — Ambulatory Visit: Payer: Self-pay | Admitting: Urology

## 2024-05-05 ENCOUNTER — Other Ambulatory Visit: Payer: Self-pay | Admitting: Urology

## 2024-05-11 ENCOUNTER — Other Ambulatory Visit: Payer: Self-pay | Admitting: Urology

## 2024-05-19 ENCOUNTER — Other Ambulatory Visit: Payer: Self-pay | Admitting: Family Medicine

## 2024-05-19 DIAGNOSIS — E782 Mixed hyperlipidemia: Secondary | ICD-10-CM

## 2024-06-15 ENCOUNTER — Ambulatory Visit: Admitting: Family Medicine

## 2024-06-15 ENCOUNTER — Encounter: Payer: Self-pay | Admitting: Family Medicine

## 2024-06-15 VITALS — BP 123/73 | HR 70 | Ht 68.0 in | Wt 270.4 lb

## 2024-06-15 DIAGNOSIS — M898X6 Other specified disorders of bone, lower leg: Secondary | ICD-10-CM | POA: Insufficient documentation

## 2024-06-15 MED ORDER — MELOXICAM 15 MG PO TABS: 15.0000 mg | ORAL_TABLET | Freq: Every day | ORAL | 0 refills | Status: AC

## 2024-06-15 NOTE — Assessment & Plan Note (Signed)
 Left leg anterior tibial pain consistent with tibial stress syndrome or possible tibial stress fracture. Pain occurs only at rest, localized to anterior tibia, likely related to compensatory weight bearing after knee improvement. No signs of DVT or concerning vascular pathology. - X-ray of knee at [facility] - Meloxicam once daily for 2 weeks - Home exercises and stretching - Ice as needed - If no improvement in 4 weeks, refer to orthopedics

## 2024-06-15 NOTE — Patient Instructions (Signed)
 It was nice to see you today,  We addressed the following topics today: -I think the pain is coming from your tibia, perhaps a tibial stress fracture or tibial stress syndrome.  I am recommending using exercises and stretching, ice as needed and meloxicam anti-inflammatory. - I have given you some exercises to do at home.  If after 4 weeks this does not get better you should go to your orthopedist. - I am sending in a order for a knee x-ray.  You can get this done at [facility] imaging at any time during the open hours. - For the localized swelling your right leg I believe it is due to a cyst.  I do not believe it is related to a blood clot.,  But if your entire calf begins to swell or your calf muscles become tender let us  know and I will order an ultrasound.  If you develop any chest pain or shortness of breath go to the emergency department  Have a great day,  Rolan Slain, MD

## 2024-06-15 NOTE — Progress Notes (Signed)
   Acute Office Visit  Subjective:     Patient ID: Garrett Holmes, male    DOB: August 17, 1966, 58 y.o.   MRN: [id number]  Chief Complaint  Patient presents with   Leg Pain    HPI Subjective - Reports dull achy pain in front part of left leg for 3-4 months, occurring only when relaxing or lying in bed - Pain does not occur when standing or walking - Sometimes interferes with sleep initiation but does not wake patient - Initially attributed symptoms to knee pain, but knee improved after injection - Family history significant for brother requiring intervention for leg pain due to vascular blockage - Right leg has intermittent muscle protrusion that becomes prominent with exertion, soft to touch, causes tingling sensation when pressed - Right leg symptoms worsen with prolonged walking on beach or sand  Medications: cholesterol medication, blood pressure medication  PMHx: knee pain (improved with injection) FHx: brother with leg vascular blockage requiring intervention Social Hx: not mentioned  ROS: denies leg swelling, denies chest pain, denies shortness of breath, denies heart racing  ROS      Objective:    BP 123/73   Pulse 70   Ht 5' 8 (1.727 m)   Wt 270 lb 6.4 oz (122.7 kg)   SpO2 95%   BMI 41.11 kg/m    Physical Exam Gen: alert, oriented Pulm: no resp distress Msk: no asymmetry or swelling in the LE . Minimal ttp over the left anterior shin.  Small area of swelling in the anterolateral right tibia.  Nontender.  Approx 3cm in diameter.    No results found for any visits on 06/15/24.      Assessment & Plan:   Tibial pain Assessment & Plan: Left leg anterior tibial pain consistent with tibial stress syndrome or possible tibial stress fracture. Pain occurs only at rest, localized to anterior tibia, likely related to compensatory weight bearing after knee improvement. No signs of DVT or concerning vascular pathology. - X-ray of knee at [facility] -  Meloxicam once daily for 2 weeks - Home exercises and stretching - Ice as needed - If no improvement in 4 weeks, refer to orthopedics  Orders: -     DG Tibia/Fibula Left; Future  Other orders -     Meloxicam; Take 1 tablet (15 mg total) by mouth daily.  Dispense: 30 tablet; Refill: 0     Return if symptoms worsen or fail to improve.  Toribio MARLA Slain, MD

## 2024-07-18 ENCOUNTER — Other Ambulatory Visit: Payer: Self-pay | Admitting: Family Medicine

## 2024-07-18 DIAGNOSIS — I10 Essential (primary) hypertension: Secondary | ICD-10-CM

## 2024-09-04 ENCOUNTER — Other Ambulatory Visit: Payer: Self-pay | Admitting: Urology

## 2024-10-14 ENCOUNTER — Ambulatory Visit: Payer: Self-pay | Admitting: Urology

## 2024-10-27 ENCOUNTER — Encounter: Payer: Self-pay | Admitting: Urology

## 2024-10-27 ENCOUNTER — Ambulatory Visit (INDEPENDENT_AMBULATORY_CARE_PROVIDER_SITE_OTHER): Admitting: Urology

## 2024-10-27 VITALS — BP 152/93 | HR 68 | Ht 68.0 in | Wt 260.0 lb

## 2024-10-27 DIAGNOSIS — N401 Enlarged prostate with lower urinary tract symptoms: Secondary | ICD-10-CM

## 2024-10-27 DIAGNOSIS — Z125 Encounter for screening for malignant neoplasm of prostate: Secondary | ICD-10-CM

## 2024-10-27 DIAGNOSIS — E291 Testicular hypofunction: Secondary | ICD-10-CM

## 2024-10-27 MED ORDER — TAMSULOSIN HCL 0.4 MG PO CAPS: 0.8000 mg | ORAL_CAPSULE | Freq: Every day | ORAL | 0 refills | Status: AC

## 2024-10-27 NOTE — Progress Notes (Signed)
 10/27/2024 12:40 PM   [name] August 30, 1966 [id number]  Referring provider: Wallace Joesph LABOR, PA [address] [place],  [place] [address]  Chief Complaint  Patient presents with   Hypogonadism   Urologic history:  1. Lower urinary tract symptoms His symptoms were bothersome enough that he desired a trial of medical management  Tamsulosin  0.4 mg   2. Hypogonadism Significant tiredness and fatigue   HPI: Garrett Holmes is a 58 y.o. male who presents for follow-up visit.  No problems since last visit May 2025 Doing well on injections with significant improvement in his tiredness and fatigue Injecting 200 mg every 2 weeks; last injection was 1 week ago Labs May 2025: Testosterone  522 ng/dL, H/H 84.7/53.6 He has noted some increased urgency and urinary hesitancy  PMH: Past Medical History:  Diagnosis Date   Allergy    Anxiety    Depression    Dyslipidemia    Hyperlipidemia     Surgical History: Past Surgical History:  Procedure Laterality Date   COLONOSCOPY  2012   HUNG    Home Medications:  Allergies as of 10/27/2024       Reactions   Sudafed [pseudoephedrine Hcl]    Wires him up, agitation        Medication List        Accurate as of October 27, 2024 12:40 PM. If you have any questions, ask your nurse or doctor.          2-3CC SYRINGE 3 ML Misc 1 mg by Does not apply route every 14 (fourteen) days.   albuterol  108 (90 Base) MCG/ACT inhaler Commonly known as: VENTOLIN  HFA Inhale 1-2 puffs into the lungs every 6 (six) hours as needed for wheezing or shortness of breath.   atorvastatin  40 MG tablet Commonly known as: LIPITOR  Take 1 tablet by mouth once daily   BD Disp Needles 18G X 1-1/2 Misc Generic drug: NEEDLE (DISP) 18 G 1 mg by Does not apply route every 14 (fourteen) days.   BD Disp Needles 21G X 1-1/2 Misc Generic drug: NEEDLE (DISP) 21 G 1 mg by Does not apply route every 14 (fourteen) days.   hydrochlorothiazide  12.5 MG  tablet Commonly known as: HYDRODIURIL  Take 1 tablet by mouth once daily   hydrOXYzine  10 MG tablet Commonly known as: ATARAX  Take 1 tablet (10 mg total) by mouth every 8 (eight) hours as needed. for anxiety   Linzess  145 MCG Caps capsule Generic drug: linaclotide  TAKE 1 CAPSULE BY MOUTH ONCE DAILY AS  NEEDED   lisinopril  40 MG tablet Commonly known as: ZESTRIL  Take 1 tablet (40 mg total) by mouth daily.   meloxicam  15 MG tablet Commonly known as: MOBIC  Take 1 tablet (15 mg total) by mouth daily.   tamsulosin  0.4 MG Caps capsule Commonly known as: FLOMAX  Take 2 capsules (0.8 mg total) by mouth daily. What changed: how much to take Changed by: Glendia JAYSON Barba   testosterone  cypionate 200 MG/ML injection Commonly known as: DEPOTESTOSTERONE CYPIONATE INJECT 1 ML INTO THE MUSCLE EVERY 14 DAYS   venlafaxine  XR 75 MG 24 hr capsule Commonly known as: EFFEXOR -XR TAKE 1 CAPSULE BY MOUTH ONCE DAILY WITH BREAKFAST        Allergies:  Allergies  Allergen Reactions   Sudafed [Pseudoephedrine Hcl]     Wires him up, agitation    Family History: Family History  Problem Relation Age of Onset   Cancer Mother 71       Renal cancer  Social History:  reports that he quit smoking about 13 years ago. His smoking use included cigarettes. He has never been exposed to tobacco smoke. He has never used smokeless tobacco. He reports current alcohol use of about 1.0 standard drink of alcohol per week. He reports that he does not use drugs.   Physical Exam: BP (!) 152/93   Pulse 68   Ht 5' 8 (1.727 m)   Wt 260 lb (117.9 kg)   BMI 39.53 kg/m   Constitutional:  Alert, No acute distress. HEENT: [place] AT Respiratory: Normal respiratory effort, no increased work of breathing. Psychiatric: Normal mood and affect.   Assessment & Plan:    1.  Hypogonadism Feeling much better after changing to intramuscular testosterone  Testosterone , H/H, PSA drawn today If stable will schedule lab  visit 6 months for testosterone , H/H and annual follow-up with testosterone , H/H and PSA  2.  BPH with LUTS Titrate tamsulosin  to 0.8 mg daily x 30 days He is not interested in starting additional medications for his lower urinary tract symptoms   Glendia JAYSON Barba, MD  [facility] [address] [place], [place] [address] [phone]

## 2024-10-28 ENCOUNTER — Ambulatory Visit: Payer: Self-pay | Admitting: Urology

## 2024-10-28 ENCOUNTER — Other Ambulatory Visit: Payer: Self-pay | Admitting: *Deleted

## 2024-10-28 DIAGNOSIS — E291 Testicular hypofunction: Secondary | ICD-10-CM

## 2024-10-28 DIAGNOSIS — Z125 Encounter for screening for malignant neoplasm of prostate: Secondary | ICD-10-CM

## 2024-10-28 LAB — HEMOGLOBIN AND HEMATOCRIT, BLOOD
Hematocrit: 49.1 % (ref 37.5–51.0)
Hemoglobin: 16.1 g/dL (ref 13.0–17.7)

## 2024-10-28 LAB — TESTOSTERONE: Testosterone: 717 ng/dL (ref 264–916)

## 2024-10-28 LAB — PSA: Prostate Specific Ag, Serum: 3.3 ng/mL (ref 0.0–4.0)

## 2024-11-15 ENCOUNTER — Other Ambulatory Visit: Payer: Self-pay | Admitting: Urology

## 2024-12-05 ENCOUNTER — Other Ambulatory Visit: Payer: Self-pay | Admitting: Urology

## 2024-12-11 ENCOUNTER — Other Ambulatory Visit: Payer: Self-pay | Admitting: Family Medicine

## 2024-12-11 DIAGNOSIS — F411 Generalized anxiety disorder: Secondary | ICD-10-CM

## 2024-12-18 ENCOUNTER — Telehealth: Payer: Self-pay | Admitting: Gastroenterology

## 2024-12-18 NOTE — Telephone Encounter (Signed)
 Patient is scheduled for 1/29. Patient last seen Dr. Rollin a few months ago for anal fissure. Patient states he would like to transfer care due to not having any progress with Dr. Rollin. Patient was advised to have records sent prior to appointment for review.

## 2025-01-07 ENCOUNTER — Other Ambulatory Visit: Payer: Self-pay | Admitting: Family Medicine

## 2025-01-07 DIAGNOSIS — I10 Essential (primary) hypertension: Secondary | ICD-10-CM

## 2025-01-09 ENCOUNTER — Other Ambulatory Visit: Payer: Self-pay | Admitting: Family Medicine

## 2025-01-09 DIAGNOSIS — I10 Essential (primary) hypertension: Secondary | ICD-10-CM

## 2025-01-14 ENCOUNTER — Ambulatory Visit (INDEPENDENT_AMBULATORY_CARE_PROVIDER_SITE_OTHER): Admitting: Gastroenterology

## 2025-01-14 ENCOUNTER — Encounter: Payer: Self-pay | Admitting: Gastroenterology

## 2025-01-14 VITALS — BP 110/76 | HR 67 | Ht 68.0 in | Wt 255.4 lb

## 2025-01-14 DIAGNOSIS — K760 Fatty (change of) liver, not elsewhere classified: Secondary | ICD-10-CM

## 2025-01-14 DIAGNOSIS — K5909 Other constipation: Secondary | ICD-10-CM

## 2025-01-14 DIAGNOSIS — K625 Hemorrhage of anus and rectum: Secondary | ICD-10-CM

## 2025-01-14 DIAGNOSIS — K6289 Other specified diseases of anus and rectum: Secondary | ICD-10-CM

## 2025-01-14 DIAGNOSIS — K602 Anal fissure, unspecified: Secondary | ICD-10-CM | POA: Diagnosis not present

## 2025-01-14 DIAGNOSIS — Z860100 Personal history of colon polyps, unspecified: Secondary | ICD-10-CM

## 2025-01-14 MED ORDER — AMBULATORY NON FORMULARY MEDICATION: 1 refills | Status: AC

## 2025-01-14 NOTE — Progress Notes (Signed)
 "  Chief Complaint:anal fissure Primary GI Doctor: Dr. Federico   HPI:  Patient is a  59  year old male patient with past medical history of anxiety, depression, hypertension, and hyperlipidemia, who was self referred to me for a evaluation of anal fissure .    Interval History Patient presents for evaluation of ongoing issues anal fissure and chronic constipation.  Reports this has been ongoing for one year with no improvement. Previously seen by Dr. Rollin. He was prescribed nitroglycerin topical with no improvement. He has also tried sitz baths.  He is currently on Linzess  145 mcg po daily for constipation which he reports causes severe diarrhea and rectal pain that feels like briers with bleeding. He was on one other constipation medication twice daily which caused same issue. He is fearful to have BM due to pain. He has to go to restroom multiple times to empty out and has to clean himself several times to get clean. He has BM every 2-3 days, last BM was Sunday.   Former smoker. He drinks a beer on Friday and/or Saturday.   Patients last colonoscopy Jan 2021. One prior in 2013.   Patient's family history includes: no esophageal CA, no colon CA.   Wt Readings from Last 3 Encounters:  01/14/25 255 lb 6 oz (115.8 kg)  10/27/24 260 lb (117.9 kg)  06/15/24 270 lb 6.4 oz (122.7 kg)     Past Medical History:  Diagnosis Date   Allergy    Anxiety    Depression    Dyslipidemia    Hyperlipidemia     Past Surgical History:  Procedure Laterality Date   COLONOSCOPY  2012   HUNG    Current Outpatient Medications  Medication Sig Dispense Refill   albuterol  (VENTOLIN  HFA) 108 (90 Base) MCG/ACT inhaler Inhale 1-2 puffs into the lungs every 6 (six) hours as needed for wheezing or shortness of breath. 1 each 0   AMBULATORY NON FORMULARY MEDICATION Medication Name: Diltiazem 2% + Lidaocaine 5% Apply a pea sized amount internally 3 times daily for 4-6 weeks 30 g 1   atorvastatin  (LIPITOR )  40 MG tablet Take 1 tablet by mouth once daily 90 tablet 3   hydrochlorothiazide  (HYDRODIURIL ) 12.5 MG tablet Take 1 tablet by mouth once daily 90 tablet 1   hydrOXYzine  (ATARAX ) 10 MG tablet Take 1 tablet (10 mg total) by mouth every 8 (eight) hours as needed. for anxiety 30 tablet 1   ketoconazole (NIZORAL) 2 % cream SMARTSIG:2 Gram(s) Topical Daily     lisinopril  (ZESTRIL ) 40 MG tablet Take 1 tablet by mouth once daily 90 tablet 1   meloxicam  (MOBIC ) 15 MG tablet Take 1 tablet (15 mg total) by mouth daily. 30 tablet 0   NEEDLE, DISP, 18 G (BD DISP NEEDLES) 18G X 1-1/2 MISC 1 mg by Does not apply route every 14 (fourteen) days. 50 each 0   NEEDLE, DISP, 21 G (BD DISP NEEDLES) 21G X 1-1/2 MISC 1 mg by Does not apply route every 14 (fourteen) days. 50 each 0   Syringe, Disposable, (2-3CC SYRINGE) 3 ML MISC 1 mg by Does not apply route every 14 (fourteen) days. 25 each 3   tamsulosin  (FLOMAX ) 0.4 MG CAPS capsule Take 2 capsules (0.8 mg total) by mouth daily. 60 capsule 0   testosterone  cypionate (DEPOTESTOSTERONE CYPIONATE) 200 MG/ML injection INJECT 1 ML INTRAMUSCULARLY EVERY 14 DAYS 10 mL 0   venlafaxine  XR (EFFEXOR -XR) 75 MG 24 hr capsule TAKE 1 CAPSULE BY MOUTH ONCE  DAILY WITH BREAKFAST 90 capsule 1   linaclotide  (LINZESS ) 145 MCG CAPS capsule TAKE 1 CAPSULE BY MOUTH ONCE DAILY AS  NEEDED (Patient not taking: Reported on 01/14/2025) 90 capsule 1   Current Facility-Administered Medications  Medication Dose Route Frequency Provider Last Rate Last Admin   methylPREDNISolone  sodium succinate (SOLU-MEDROL ) 130 mg in sodium chloride  0.9 % 50 mL IVPB  130 mg Intravenous Once Abonza, Maritza, PA-C        Allergies as of 01/14/2025 - Review Complete 01/14/2025  Allergen Reaction Noted   Sudafed [pseudoephedrine hcl]  12/12/2016    Family History  Problem Relation Age of Onset   Cancer Mother 72       Renal cancer    Review of Systems:    Constitutional: No weight loss, fever, chills,  weakness or fatigue HEENT: Eyes: No change in vision               Ears, Nose, Throat:  No change in hearing or congestion Skin: No rash or itching Cardiovascular: No chest pain, chest pressure or palpitations   Respiratory: No SOB or cough Gastrointestinal: See HPI and otherwise negative Genitourinary: No dysuria or change in urinary frequency Neurological: No headache, dizziness or syncope Musculoskeletal: No new muscle or joint pain Hematologic: No bleeding or bruising Psychiatric: No history of depression or anxiety    Physical Exam:  Vital signs: BP 110/76   Pulse 67   Ht 5' 8 (1.727 m)   Wt 255 lb 6 oz (115.8 kg)   BMI 38.83 kg/m   Constitutional:   Pleasant male appears to be in NAD, Well developed, Well nourished, alert and cooperative Eyes:   PEERL, EOMI. No icterus. Conjunctiva pink. Neck:  Supple Throat: Oral cavity and pharynx without inflammation, swelling or lesion.  Respiratory: Respirations even and unlabored. Lungs clear to auscultation bilaterally.   No wheezes, crackles, or rhonchi.  Cardiovascular: Normal S1, S2. Regular rate and rhythm. No peripheral edema, cyanosis or pallor.  Gastrointestinal:  Soft, nondistended, nontender. No rebound or guarding. Normal bowel sounds. No appreciable masses or hepatomegaly. Rectal: Normal external rectal exam, normal rectal tone, posterior anal fissure noted, tender, no masses, brown stool, hemoccult N/A. Chaperone Denise  Anoscopy: unable to tolerate due to pain Msk:  Symmetrical without gross deformities. Without edema, no deformity or joint abnormality.  Neurologic:  Alert and  oriented x4;  grossly normal neurologically.  Skin:   Dry and intact without significant lesions or rashes.  RELEVANT LABS AND IMAGING: CBC    Latest Ref Rng & Units 10/27/2024   11:37 AM 04/21/2024    8:58 AM 03/02/2024    8:54 AM  CBC  WBC 3.4 - 10.8 x10E3/uL   10.2   Hemoglobin 13.0 - 17.7 g/dL 83.8  84.7  85.6   Hematocrit 37.5 - 51.0 %  49.1  46.3  42.8   Platelets 150 - 450 x10E3/uL   255      CMP     Latest Ref Rng & Units 11/08/2023    8:58 AM 07/09/2023    8:36 AM 03/05/2023    8:43 AM  CMP  Glucose 70 - 99 mg/dL 85  92  90   BUN 6 - 24 mg/dL 16  14  15    Creatinine 0.76 - 1.27 mg/dL 8.78  8.87  9.00   Sodium 134 - 144 mmol/L 141  141  138   Potassium 3.5 - 5.2 mmol/L 5.0  4.5  4.6   Chloride 96 - 106  mmol/L 106  103  102   CO2 20 - 29 mmol/L 21  22  19    Calcium  8.7 - 10.2 mg/dL 9.5  9.0  9.3   Total Protein 6.0 - 8.5 g/dL 7.1  6.6  7.0   Total Bilirubin 0.0 - 1.2 mg/dL 0.3  0.4  0.5   Alkaline Phos 44 - 121 IU/L 79  76  110   AST 0 - 40 IU/L 26  26  31    ALT 0 - 44 IU/L 41  29  44      Lab Results  Component Value Date   TSH 1.910 03/02/2024   4/22 echo -LVEF 65% at rest.   GI procedures: 12/2019 colonoscopy Two 1-2 mm polyps in the transverse colon. Repeat 7 years  01/2012 colonoscopy Normal  Imaging: 06/2022 US  Abd RUQ IMPRESSION: Hepatic steatosis. Possible morphologic changes could reflect underlying fibrosis.  Assessment: Encounter Diagnoses  Name Primary?   Anal fissure Yes   Rectal pain    BRBPR (bright red blood per rectum)    Chronic constipation    History of colonic polyps    Hepatic steatosis    59 year old male patient who presents with ongoing issues with anal fissure and chronic constipation for the past year.  Patient has tried over-the-counter stool softeners, pro secretory agent Linzess , sitz bath's, and nitroglycerin topical ointment with no improvement in symptoms.  Patient reports severe rectal discomfort with defecation as well as bright red blood with wiping.  Upon physical examination with DRE posterior anal fissure noted. We discussed switching to diltiazem with lidocaine ointment for the next 6 weeks as well as other body hygiene occasion provided.  Will also provide samples of lower dose of Linzess  to see if easier for patient to tolerate with current symptoms and  stool softeners as needed.  Per records last colonoscopy January 2021 with 2 polyps.  Will go ahead and request records to review.  Will have patient follow-up in 6 weeks and if no improvement Itsel Opfer consider surgical referral to Colon Rectal surgeon for other options.  1. Anal fissures - Start Diltiazem with lidocaine TID -stiz baths TID -Education and body hygiene -Purchase donut pillow -consider surgical options if no improvement  2. Chronic constipation -continue stool softeners -samples Linzess  72 mcg po daily  -Plenty of fluids 3. Hepatic steatosis -will discuss at follow-up -pamphlet handed out  4. History of colonic polyps , recall 7 years -request records from Dr. Curtis office   Follow-up in 6 weeks with me   Thank you for the courtesy of this consult. Please call me with any questions or concerns.   Stefan Karen, FNP-C [facility] 01/14/2025, 9:09 AM  Cc: Chandra Toribio POUR, MD  "

## 2025-01-14 NOTE — Patient Instructions (Addendum)
 Constipation Continue stool softeners Continue Linzess , samples for lower dose 72 mcg po daily   Drink plenty of fluids   Anal fissure  Pamphlet attached  Start Diltiazem with lidocaine , apply pea sized amount with gloved finger Pay dry Use only water to clean  Doughnut pillow may help with prolonged sitting or standing desk at work   Fatty liver  Pamphlet attached, can discuss in future when other issues have resolved  We have sent a prescription for Diltiazem 2% with lidocaine 5% cream to [facility] for you. Using your index finger, you should apply a small amount of medication inside the rectum up to your first knuckle/joint 3 times daily x 4 - 6 weeks.  [facility]'s information is below: Address: [address] [place], [place] [address]  [phone]  *Please DO NOT go directly from our office to pick up this medication! Give the pharmacy 1 day to process the prescription as this is compounded and takes time to make.  _______________________________________________________  If your blood pressure at your visit was 140/90 or greater, please contact your primary care physician to follow up on this.  _______________________________________________________  If you are age 13 or older, your body mass index should be between 23-30. Your Body mass index is 38.83 kg/m. If this is out of the aforementioned range listed, please consider follow up with your Primary Care Provider.  If you are age 51 or younger, your body mass index should be between 19-25. Your Body mass index is 38.83 kg/m. If this is out of the aformentioned range listed, please consider follow up with your Primary Care Provider.   ________________________________________________________  The [facility] providers would like to encourage you to use MYCHART to communicate with providers for non-urgent requests or questions.  Due to long hold times on the telephone, sending your provider a  message by [facility] may be a faster and more efficient way to get a response.  Please allow 48 business hours for a response.  Please remember that this is for non-urgent requests.  _______________________________________________________  [facility] is using a team-based approach to care.  Your team is made up of your doctor and two to three APPS. Our APPS (Nurse Practitioners and Physician Assistants) work with your physician to ensure care continuity for you. They are fully qualified to address your health concerns and develop a treatment plan. They communicate directly with your gastroenterologist to care for you. Seeing the Advanced Practice Practitioners on your physician's team can help you by facilitating care more promptly, often allowing for earlier appointments, access to diagnostic testing, procedures, and other specialty referrals.   Thank you for trusting me with your gastrointestinal care. Deanna May, FNP-C

## 2025-02-25 ENCOUNTER — Ambulatory Visit: Admitting: Gastroenterology

## 2025-03-09 ENCOUNTER — Other Ambulatory Visit

## 2025-03-16 ENCOUNTER — Ambulatory Visit: Admitting: Family Medicine

## 2025-04-27 ENCOUNTER — Other Ambulatory Visit

## 2025-10-28 ENCOUNTER — Other Ambulatory Visit

## 2025-10-29 ENCOUNTER — Ambulatory Visit: Admitting: Urology
